# Patient Record
Sex: Female | Born: 1980 | Race: Black or African American | Hispanic: No | Marital: Single | Smoking: Never smoker
Health system: Southern US, Community
[De-identification: ages and names within clinical notes are randomized; demographics above are authoritative.]

## PROBLEM LIST (undated history)

## (undated) DIAGNOSIS — IMO0002 Reserved for concepts with insufficient information to code with codable children: Secondary | ICD-10-CM

## (undated) DIAGNOSIS — Z8659 Personal history of other mental and behavioral disorders: Secondary | ICD-10-CM

## (undated) DIAGNOSIS — K589 Irritable bowel syndrome without diarrhea: Secondary | ICD-10-CM

## (undated) DIAGNOSIS — O98819 Other maternal infectious and parasitic diseases complicating pregnancy, unspecified trimester: Secondary | ICD-10-CM

## (undated) DIAGNOSIS — B951 Streptococcus, group B, as the cause of diseases classified elsewhere: Secondary | ICD-10-CM

## (undated) DIAGNOSIS — Z87898 Personal history of other specified conditions: Secondary | ICD-10-CM

## (undated) DIAGNOSIS — Z6791 Unspecified blood type, Rh negative: Secondary | ICD-10-CM

## (undated) DIAGNOSIS — Z87440 Personal history of urinary (tract) infections: Secondary | ICD-10-CM

## (undated) DIAGNOSIS — D649 Anemia, unspecified: Secondary | ICD-10-CM

## (undated) DIAGNOSIS — Z5189 Encounter for other specified aftercare: Secondary | ICD-10-CM

## (undated) DIAGNOSIS — O00109 Unspecified tubal pregnancy without intrauterine pregnancy: Secondary | ICD-10-CM

## (undated) DIAGNOSIS — E611 Iron deficiency: Secondary | ICD-10-CM

## (undated) DIAGNOSIS — Z8632 Personal history of gestational diabetes: Secondary | ICD-10-CM

## (undated) DIAGNOSIS — R7303 Prediabetes: Secondary | ICD-10-CM

## (undated) DIAGNOSIS — Z8719 Personal history of other diseases of the digestive system: Secondary | ICD-10-CM

## (undated) DIAGNOSIS — Z8619 Personal history of other infectious and parasitic diseases: Secondary | ICD-10-CM

## (undated) HISTORY — DX: Personal history of gestational diabetes: Z86.32

## (undated) HISTORY — DX: Encounter for other specified aftercare: Z51.89

## (undated) HISTORY — DX: Personal history of other mental and behavioral disorders: Z86.59

## (undated) HISTORY — DX: Streptococcus, group b, as the cause of diseases classified elsewhere: B95.1

## (undated) HISTORY — DX: Unspecified blood type, rh negative: Z67.91

## (undated) HISTORY — DX: Anemia, unspecified: D64.9

## (undated) HISTORY — DX: Prediabetes: R73.03

## (undated) HISTORY — DX: Personal history of other specified conditions: Z87.898

## (undated) HISTORY — DX: Personal history of other diseases of the digestive system: Z87.19

## (undated) HISTORY — DX: Personal history of urinary (tract) infections: Z87.440

## (undated) HISTORY — DX: Reserved for concepts with insufficient information to code with codable children: IMO0002

## (undated) HISTORY — DX: Iron deficiency: E61.1

## (undated) HISTORY — DX: Irritable bowel syndrome, unspecified: K58.9

## (undated) HISTORY — DX: Personal history of other infectious and parasitic diseases: Z86.19

## (undated) HISTORY — DX: Other maternal infectious and parasitic diseases complicating pregnancy, unspecified trimester: O98.819

---
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
## 1999-03-13 ENCOUNTER — Other Ambulatory Visit: Admission: RE | Admit: 1999-03-13 | Discharge: 1999-03-13 | Payer: Self-pay | Admitting: Family Medicine

## 2000-03-13 ENCOUNTER — Other Ambulatory Visit: Admission: RE | Admit: 2000-03-13 | Discharge: 2000-03-13 | Payer: Self-pay | Admitting: Family Medicine

## 2001-04-17 ENCOUNTER — Other Ambulatory Visit: Admission: RE | Admit: 2001-04-17 | Discharge: 2001-04-17 | Payer: Self-pay | Admitting: Family Medicine

## 2002-04-19 ENCOUNTER — Other Ambulatory Visit: Admission: RE | Admit: 2002-04-19 | Discharge: 2002-04-19 | Payer: Self-pay | Admitting: Family Medicine

## 2002-08-11 ENCOUNTER — Emergency Department (HOSPITAL_COMMUNITY): Admission: EM | Admit: 2002-08-11 | Discharge: 2002-08-11 | Payer: Self-pay | Admitting: Emergency Medicine

## 2003-04-20 ENCOUNTER — Other Ambulatory Visit: Admission: RE | Admit: 2003-04-20 | Discharge: 2003-04-20 | Payer: Self-pay | Admitting: Family Medicine

## 2004-07-09 ENCOUNTER — Other Ambulatory Visit: Admission: RE | Admit: 2004-07-09 | Discharge: 2004-07-09 | Payer: Self-pay | Admitting: Family Medicine

## 2004-08-22 ENCOUNTER — Other Ambulatory Visit: Admission: RE | Admit: 2004-08-22 | Discharge: 2004-08-22 | Payer: Self-pay | Admitting: Obstetrics and Gynecology

## 2005-03-21 ENCOUNTER — Other Ambulatory Visit: Admission: RE | Admit: 2005-03-21 | Discharge: 2005-03-21 | Payer: Self-pay | Admitting: Obstetrics and Gynecology

## 2006-07-28 ENCOUNTER — Other Ambulatory Visit: Admission: RE | Admit: 2006-07-28 | Discharge: 2006-07-28 | Payer: Self-pay | Admitting: Family Medicine

## 2006-12-22 ENCOUNTER — Emergency Department (HOSPITAL_COMMUNITY): Admission: EM | Admit: 2006-12-22 | Discharge: 2006-12-22 | Payer: Self-pay | Admitting: Emergency Medicine

## 2006-12-25 ENCOUNTER — Emergency Department (HOSPITAL_COMMUNITY): Admission: EM | Admit: 2006-12-25 | Discharge: 2006-12-25 | Payer: Self-pay | Admitting: Emergency Medicine

## 2007-06-10 ENCOUNTER — Emergency Department (HOSPITAL_COMMUNITY): Admission: EM | Admit: 2007-06-10 | Discharge: 2007-06-10 | Payer: Self-pay | Admitting: Emergency Medicine

## 2008-09-02 ENCOUNTER — Other Ambulatory Visit: Admission: RE | Admit: 2008-09-02 | Discharge: 2008-09-02 | Payer: Self-pay | Admitting: Family Medicine

## 2009-06-06 ENCOUNTER — Emergency Department (HOSPITAL_COMMUNITY): Admission: EM | Admit: 2009-06-06 | Discharge: 2009-06-07 | Payer: Self-pay | Admitting: Emergency Medicine

## 2009-11-28 ENCOUNTER — Other Ambulatory Visit: Admission: RE | Admit: 2009-11-28 | Discharge: 2009-11-28 | Payer: Self-pay | Admitting: Family Medicine

## 2010-07-31 DIAGNOSIS — Z8719 Personal history of other diseases of the digestive system: Secondary | ICD-10-CM

## 2010-07-31 DIAGNOSIS — IMO0001 Reserved for inherently not codable concepts without codable children: Secondary | ICD-10-CM

## 2010-07-31 DIAGNOSIS — Z5189 Encounter for other specified aftercare: Secondary | ICD-10-CM

## 2010-07-31 HISTORY — PX: SALPINGECTOMY: SHX328

## 2010-07-31 HISTORY — DX: Encounter for other specified aftercare: Z51.89

## 2010-07-31 HISTORY — PX: ECTOPIC PREGNANCY SURGERY: SHX613

## 2010-07-31 HISTORY — DX: Personal history of other diseases of the digestive system: Z87.19

## 2010-07-31 HISTORY — DX: Reserved for inherently not codable concepts without codable children: IMO0001

## 2010-08-05 ENCOUNTER — Emergency Department (HOSPITAL_COMMUNITY)
Admission: EM | Admit: 2010-08-05 | Discharge: 2010-08-05 | Disposition: A | Discharge status: Other Acute Inpatient Hospital | Payer: BC Managed Care – PPO | Source: Home / Self Care | Attending: Emergency Medicine | Admitting: Emergency Medicine

## 2010-08-05 ENCOUNTER — Emergency Department (HOSPITAL_COMMUNITY): Payer: BC Managed Care – PPO

## 2010-08-05 ENCOUNTER — Ambulatory Visit (HOSPITAL_COMMUNITY)
Admission: AD | Admit: 2010-08-05 | Discharge: 2010-08-05 | Disposition: A | Discharge status: Home / Self Care | Payer: BC Managed Care – PPO | Source: Ambulatory Visit | Attending: Obstetrics and Gynecology | Admitting: Obstetrics and Gynecology

## 2010-08-05 ENCOUNTER — Other Ambulatory Visit: Payer: Self-pay | Admitting: Obstetrics and Gynecology

## 2010-08-05 DIAGNOSIS — O009 Unspecified ectopic pregnancy without intrauterine pregnancy: Secondary | ICD-10-CM | POA: Insufficient documentation

## 2010-08-05 DIAGNOSIS — O00109 Unspecified tubal pregnancy without intrauterine pregnancy: Secondary | ICD-10-CM | POA: Insufficient documentation

## 2010-08-05 DIAGNOSIS — R109 Unspecified abdominal pain: Secondary | ICD-10-CM | POA: Insufficient documentation

## 2010-08-05 LAB — WET PREP, GENITAL: Clue Cells Wet Prep HPF POC: NONE SEEN

## 2010-08-05 LAB — URINALYSIS, ROUTINE W REFLEX MICROSCOPIC
Bilirubin Urine: NEGATIVE
Glucose, UA: NEGATIVE mg/dL
Ketones, ur: NEGATIVE mg/dL
Nitrite: NEGATIVE
Protein, ur: NEGATIVE mg/dL
Specific Gravity, Urine: 1.035 — ABNORMAL HIGH (ref 1.005–1.030)
pH: 5.5 (ref 5.0–8.0)

## 2010-08-05 LAB — CBC
HCT: 36.5 % (ref 36.0–46.0)
Hemoglobin: 12 g/dL (ref 12.0–15.0)
MCH: 31.7 pg (ref 26.0–34.0)
MCHC: 32.9 g/dL (ref 30.0–36.0)
MCV: 94.9 fL (ref 78.0–100.0)
Platelets: 230 10*3/uL (ref 150–400)
RBC: 3.93 MIL/uL (ref 3.87–5.11)
RDW: 12.3 % (ref 11.5–15.5)
WBC: 8.4 10*3/uL (ref 4.0–10.5)

## 2010-08-05 LAB — COMPREHENSIVE METABOLIC PANEL
AST: 24 U/L (ref 0–37)
Albumin: 3.1 g/dL — ABNORMAL LOW (ref 3.5–5.2)
BUN: 10 mg/dL (ref 6–23)
Calcium: 8.8 mg/dL (ref 8.4–10.5)
GFR calc Af Amer: >60 mL/min (ref >60)
GFR calc non Af Amer: >60 mL/min (ref >60)
Glucose, Bld: 103 mg/dL — ABNORMAL HIGH (ref 70–99)
Potassium: 4.4 mEq/L (ref 3.5–5.1)
Sodium: 136 mEq/L (ref 135–145)
Total Bilirubin: 0.2 mg/dL — ABNORMAL LOW (ref 0.3–1.2)
Total Protein: 6.5 g/dL (ref 6.0–8.3)

## 2010-08-05 LAB — HCG, QUANTITATIVE, PREGNANCY: hCG, Beta Chain, Quant, S: 9829 m[IU]/mL — ABNORMAL HIGH (ref <5)

## 2010-08-05 LAB — DIFFERENTIAL
Basophils Relative: 0 % (ref 0–1)
Lymphocytes Relative: 17 % (ref 12–46)
Lymphs Abs: 1.4 10*3/uL (ref 0.7–4.0)
Monocytes Absolute: 0.7 10*3/uL (ref 0.1–1.0)
Neutro Abs: 6 10*3/uL (ref 1.7–7.7)
Neutrophils Relative %: 71 % (ref 43–77)

## 2010-08-05 LAB — URINE MICROSCOPIC-ADD ON

## 2010-08-05 LAB — LIPASE, BLOOD: Lipase: 21 U/L (ref 11–59)

## 2010-08-05 LAB — ABO/RH: ABO/RH(D): O NEG

## 2010-08-06 LAB — RHOGAM INJECTION

## 2010-08-06 LAB — URINE CULTURE: Colony Count: 15000

## 2010-08-07 LAB — GC/CHLAMYDIA PROBE AMP, GENITAL
Chlamydia, DNA Probe: NEGATIVE
GC Probe Amp, Genital: NEGATIVE

## 2010-08-09 NOTE — Op Note (Signed)
Susan Hale, Susan Hale                ACCOUNT NO.:  [id number]  MEDICAL RECORD NO.:  [id number]           PATIENT TYPE:  O  LOCATION:  [facility]                          [facility]  PHYSICIAN:  Osborn Coho, M.D.   DATE OF BIRTH:  [birth year]  DATE OF PROCEDURE:  08/05/2010 DATE OF DISCHARGE:                              OPERATIVE REPORT   PREOPERATIVE DIAGNOSES: 1. Right ectopic pregnancy. 2. Abdominal pain.  POSTOPERATIVE DIAGNOSES: 1. Ruptured right ectopic pregnancy. 2. Abdominal pain.  PROCEDURE:  Laparoscopic right salpingectomy.  ATTENDING DOCTOR:  Osborn Coho, MD  ANESTHESIA:  General.  SPECIMENS TO PATHOLOGY:  Right fallopian tube with ectopic pregnancy.  FINDINGS:  Ruptured right fallopian tube with ectopic pregnancy.  FLUIDS:  2100 mL.  URINE OUTPUT:  100 mL of clear urine.  ESTIMATED BLOOD LOSS:  150 mL hemoperitoneum and minimal EBL intraop.  COMPLICATIONS:  None.  PROCEDURE:  The patient was taken to the operating room after the risks, benefits, and alternatives were discussed with the patient.  The patient verbalized understanding and consent was signed and witnessed.  The patient was placed under general anesthesia and prepped and draped in the normal sterile fashion in the dorsal lithotomy position.  A bivalve speculum was placed in the patient's vagina and the Hulka tenaculum placed for intrauterine manipulation.  After gowning and re-gloving, attention was then turned to the abdomen where a 10-mm incision was made at the umbilicus after injecting 10 mL of 0.25% Marcaine.  The Veress needle was introduced into the intraabdominal cavity and pneumoperitoneum achieved.  The 10-mm trocar was advanced into the intraabdominal cavity.  Laparoscope was introduced and hemoperitoneum noted.  Attention was then turned to left lower quadrant where a 5-mm incision was made and a 5-mm trocar advanced under direct visualization. A 5-mm incision was made at the  suprapubic site and 5-mm trocar advanced under direct visualization.  The right fallopian tube was elevated after inspecting the abdomen and bilateral ovaries were noted to be within normal limits.  The left fallopian tube appeared to be within normal limits.  The right fallopian tube contained an ruptured ectopic pregnancy.  The right fallopian tube was grasped and elevated and the ectopic pregnancy along with the fallopian tube was cauterized and cut with the gyrus.  All specimens were placed in Endopouches, which were removed via a 10-mm suprapubic port.  The 5-mm trocar had been removed and the incision at the suprapubic port was increased to 10 mm and a 10- mm trocar introduced.  All specimens were sent to Pathology.  The intraabdominal cavity was copiously irrigated and the right pedicle was noted to be hemostatic.  The patient was taken out of Trendelenburg and pneumoperitoneum relieved, and the suprapubic trocar and the left lower quadrant trocar were removed under direct visualization.  The umbilical trocar was removed under direct visualization as well after pneumoperitoneum relieved completely.  The fascia was repaired at the umbilicus with 0 Vicryl via figure-of-eight stitch and the same was done at the suprapubic 10-mm incision.  The subcutaneous tissue was reapproximated with interrupted stitch at the umbilicus and the suprapubic incision.  All skin incisions were then repaired with Dermabond.  Sponge, lap, and needle count was correct.  The Hulka tenaculum was removed.  The patient tolerated the procedure well and was awaiting transfer to the recovery room in good condition.     Osborn Coho, M.D.     AR/MEDQ  D:  08/05/2010  T:  08/05/2010  Job:  [id number]  Electronically Signed by Osborn Coho M.D. on 08/09/2010 07:40:38 AM

## 2010-10-30 LAB — HIV ANTIBODY (ROUTINE TESTING W REFLEX): HIV: NONREACTIVE

## 2010-10-30 LAB — RPR: RPR: NONREACTIVE

## 2011-01-10 LAB — WOUND CULTURE: Gram Stain: NONE SEEN

## 2011-02-20 ENCOUNTER — Encounter: Payer: Self-pay | Admitting: Dietician

## 2011-02-20 ENCOUNTER — Encounter: Payer: BC Managed Care – PPO | Attending: Obstetrics and Gynecology | Admitting: Dietician

## 2011-02-20 DIAGNOSIS — Z713 Dietary counseling and surveillance: Secondary | ICD-10-CM | POA: Insufficient documentation

## 2011-02-20 DIAGNOSIS — E119 Type 2 diabetes mellitus without complications: Secondary | ICD-10-CM | POA: Insufficient documentation

## 2011-02-20 NOTE — Progress Notes (Signed)
  Patient was seen on 02/20/2011 for Gestational Diabetes self-management class at the [facility]. The following learning objectives were met by the patient during this course:   States the definition of Gestational Diabetes  States why dietary management is important in controlling blood glucose  Describes the effects each nutrient has on blood glucose levels  Demonstrates ability to create a balanced meal plan  Demonstrates carbohydrate counting   States when to check blood glucose levels  Demonstrates proper blood glucose monitoring techniques  States the effect of stress and exercise on blood glucose levels  States the importance of limiting caffeine and abstaining from alcohol and smoking  Blood glucose monitor given: One Touch Verio Meter (Lot #:[id number]  And expiration: 05/2012) and  2 Box Lancets (Lot: #[id number]  Expiration 05/2012)  Blood glucose reading: 88 mg post Breakfast. Note:  I did not have the preferred Medicaid Accu-Chek meter today.  I have contacted the rep and she is shipping [place] the Medicaid meters.  I have supplied the patient with a meter and strips to cover until the correct meter arrives on Monday (02/25/2011).  Patient instructed to monitor glucose levels: FBS: 60 - <90 1 hour: <140 2 hour: <120  Patient received handouts:  Nutrition Diabetes and Pregnancy  Carbohydrate Counting List  Patient will be seen for follow-up as needed.

## 2011-02-26 ENCOUNTER — Encounter: Payer: Self-pay | Admitting: *Deleted

## 2011-04-01 ENCOUNTER — Encounter (HOSPITAL_COMMUNITY): Payer: Self-pay

## 2011-04-01 ENCOUNTER — Inpatient Hospital Stay (HOSPITAL_COMMUNITY)
Admission: AD | Admit: 2011-04-01 | Discharge: 2011-04-01 | Disposition: A | Discharge status: Home / Self Care | Payer: BC Managed Care – PPO | Source: Ambulatory Visit | Attending: Obstetrics and Gynecology | Admitting: Obstetrics and Gynecology

## 2011-04-01 DIAGNOSIS — Z298 Encounter for other specified prophylactic measures: Secondary | ICD-10-CM | POA: Insufficient documentation

## 2011-04-01 DIAGNOSIS — Z3480 Encounter for supervision of other normal pregnancy, unspecified trimester: Secondary | ICD-10-CM | POA: Insufficient documentation

## 2011-04-01 DIAGNOSIS — Z2989 Encounter for other specified prophylactic measures: Secondary | ICD-10-CM | POA: Insufficient documentation

## 2011-04-01 HISTORY — DX: Unspecified tubal pregnancy without intrauterine pregnancy: O00.109

## 2011-04-01 MED ORDER — RHO D IMMUNE GLOBULIN 1500 UNIT/2ML IJ SOLN
300.0000 ug | Freq: Once | INTRAMUSCULAR | Status: AC
  Administered 2011-04-01: 300 ug via INTRAMUSCULAR

## 2011-04-01 NOTE — Progress Notes (Signed)
Pt states here for rhophylac injection only, denies pain or bleeding, +FM.

## 2011-04-02 LAB — RH IG WORKUP (INCLUDES ABO/RH)
Antibody Screen: NEGATIVE
Unit division: 0

## 2011-04-02 NOTE — L&D Delivery Note (Signed)
Delivery Note At 10:13 AM a viable female, "Susan Hale", was delivered at home attended by [employer] and EMS via Vaginal, Spontaneous Delivery (Presentation: ;  ).  Reported onset of contractions at 2am, but not very painful.  Unknown SROM time (EMS reports it occurred prior to their arrival).  Placenta undelivered upon arrival at [facility].    APGAR: 8, 9; weight 7 lb 13 oz (3544 g).   Placenta status: Intact, Spontaneous.  Cord: 3 vessels with the following complications: None.  Cord pH: NA. No lacerations noted.   Review of patient's prenatal record revealed + GBS status and elevated 1 hour, 3 hour, and Hgb A1C likely consistent with Type 2 diabetes--patient has not been on any medication and has not been monitoring blood sugars at home.  Anesthesia: None  Episiotomy: None Lacerations: None Suture Repair: NA Est. Blood Loss (mL): 150 cc  Mom to postpartum.  Baby to mom's arms.  Plans bottlefeeding. Initial BP elevated, then improved. Will check PIH labs, FBS tomorrow, and 2 hour pcs starting today.  Nigel Bridgeman 06/05/2011, 11:43 AM

## 2011-05-20 LAB — GC/CHLAMYDIA PROBE AMP, GENITAL
Chlamydia: NEGATIVE
Gonorrhea: NEGATIVE

## 2011-05-30 ENCOUNTER — Encounter (INDEPENDENT_AMBULATORY_CARE_PROVIDER_SITE_OTHER): Payer: BC Managed Care – PPO | Admitting: Registered Nurse

## 2011-05-30 DIAGNOSIS — Z331 Pregnant state, incidental: Secondary | ICD-10-CM

## 2011-06-03 ENCOUNTER — Encounter (INDEPENDENT_AMBULATORY_CARE_PROVIDER_SITE_OTHER): Payer: BC Managed Care – PPO | Admitting: Obstetrics and Gynecology

## 2011-06-03 DIAGNOSIS — Z331 Pregnant state, incidental: Secondary | ICD-10-CM

## 2011-06-05 ENCOUNTER — Inpatient Hospital Stay (HOSPITAL_COMMUNITY)
Admission: AD | Admit: 2011-06-05 | Discharge: 2011-06-07 | DRG: 376 | Disposition: A | Discharge status: Home Health | Payer: BC Managed Care – PPO | Attending: Obstetrics and Gynecology | Admitting: Obstetrics and Gynecology

## 2011-06-05 ENCOUNTER — Encounter (HOSPITAL_COMMUNITY): Payer: Self-pay | Admitting: *Deleted

## 2011-06-05 DIAGNOSIS — Z331 Pregnant state, incidental: Secondary | ICD-10-CM

## 2011-06-05 DIAGNOSIS — E119 Type 2 diabetes mellitus without complications: Secondary | ICD-10-CM | POA: Diagnosis present

## 2011-06-05 DIAGNOSIS — O2493 Unspecified diabetes mellitus in the puerperium: Principal | ICD-10-CM | POA: Diagnosis present

## 2011-06-05 DIAGNOSIS — O99893 Other specified diseases and conditions complicating puerperium: Secondary | ICD-10-CM | POA: Diagnosis present

## 2011-06-05 DIAGNOSIS — Z22330 Carrier of Group B streptococcus: Secondary | ICD-10-CM

## 2011-06-05 LAB — CBC
Hemoglobin: 14.6 g/dL (ref 12.0–15.0)
MCH: 32.2 pg (ref 26.0–34.0)
MCHC: 33.5 g/dL (ref 30.0–36.0)
Platelets: 180 10*3/uL (ref 150–400)
RBC: 4.54 MIL/uL (ref 3.87–5.11)

## 2011-06-05 LAB — COMPREHENSIVE METABOLIC PANEL
ALT: 27 U/L (ref 0–35)
AST: 33 U/L (ref 0–37)
Alkaline Phosphatase: 194 U/L — ABNORMAL HIGH (ref 39–117)
CO2: 23 mEq/L (ref 19–32)
Calcium: 9.4 mg/dL (ref 8.4–10.5)
GFR calc Af Amer: 85 mL/min — ABNORMAL LOW (ref >90)
Glucose, Bld: 86 mg/dL (ref 70–99)
Potassium: 4.2 mEq/L (ref 3.5–5.1)
Sodium: 136 mEq/L (ref 135–145)
Total Protein: 5.8 g/dL — ABNORMAL LOW (ref 6.0–8.3)

## 2011-06-05 LAB — GLUCOSE, CAPILLARY
Glucose-Capillary: 164 mg/dL — ABNORMAL HIGH (ref 70–99)
Glucose-Capillary: 105 mg/dL — ABNORMAL HIGH (ref 70–99)

## 2011-06-05 MED ORDER — IBUPROFEN 600 MG PO TABS
600.0000 mg | ORAL_TABLET | Freq: Four times a day (QID) | ORAL | Status: DC
  Administered 2011-06-05 – 2011-06-07 (×8): 600 mg via ORAL
  Filled 2011-06-05 (×8): qty 1

## 2011-06-05 MED ORDER — ZOLPIDEM TARTRATE 5 MG PO TABS: 5.0000 mg | ORAL_TABLET | Freq: Every evening | ORAL | Status: DC | PRN

## 2011-06-05 MED ORDER — ONDANSETRON HCL 4 MG PO TABS: 4.0000 mg | ORAL_TABLET | ORAL | Status: DC | PRN

## 2011-06-05 MED ORDER — OXYTOCIN 20 UNITS IN LACTATED RINGERS INFUSION - SIMPLE
INTRAVENOUS | Status: AC
  Filled 2011-06-05: qty 1000

## 2011-06-05 MED ORDER — TETANUS-DIPHTH-ACELL PERTUSSIS 5-2.5-18.5 LF-MCG/0.5 IM SUSP
0.5000 mL | Freq: Once | INTRAMUSCULAR | Status: AC
  Administered 2011-06-06: 0.5 mL via INTRAMUSCULAR
  Filled 2011-06-05: qty 0.5

## 2011-06-05 MED ORDER — SIMETHICONE 80 MG PO CHEW: 80.0000 mg | CHEWABLE_TABLET | ORAL | Status: DC | PRN

## 2011-06-05 MED ORDER — DIBUCAINE 1 % RE OINT
1.0000 "application " | TOPICAL_OINTMENT | RECTAL | Status: DC | PRN
  Filled 2011-06-05: qty 28

## 2011-06-05 MED ORDER — OXYCODONE-ACETAMINOPHEN 5-325 MG PO TABS
1.0000 | ORAL_TABLET | ORAL | Status: DC | PRN
  Administered 2011-06-05 – 2011-06-06 (×2): 1 via ORAL
  Filled 2011-06-05 (×2): qty 1

## 2011-06-05 MED ORDER — LANOLIN HYDROUS EX OINT: TOPICAL_OINTMENT | CUTANEOUS | Status: DC | PRN

## 2011-06-05 MED ORDER — OXYTOCIN 20 UNITS IN LACTATED RINGERS INFUSION - SIMPLE
125.0000 mL/h | INTRAVENOUS | Status: DC | PRN
  Administered 2011-06-05: 999 mL/h via INTRAVENOUS

## 2011-06-05 MED ORDER — DIPHENHYDRAMINE HCL 25 MG PO CAPS: 25.0000 mg | ORAL_CAPSULE | Freq: Four times a day (QID) | ORAL | Status: DC | PRN

## 2011-06-05 MED ORDER — WITCH HAZEL-GLYCERIN EX PADS: 1.0000 "application " | MEDICATED_PAD | CUTANEOUS | Status: DC | PRN

## 2011-06-05 MED ORDER — PRENATAL MULTIVITAMIN CH
1.0000 | ORAL_TABLET | Freq: Every day | ORAL | Status: DC
  Administered 2011-06-05 – 2011-06-07 (×3): 1 via ORAL
  Filled 2011-06-05 (×3): qty 1

## 2011-06-05 MED ORDER — ONDANSETRON HCL 4 MG/2ML IJ SOLN: 4.0000 mg | INTRAMUSCULAR | Status: DC | PRN

## 2011-06-05 MED ORDER — BENZOCAINE-MENTHOL 20-0.5 % EX AERO
INHALATION_SPRAY | CUTANEOUS | Status: AC
  Administered 2011-06-05: 1 via TOPICAL
  Filled 2011-06-05: qty 56

## 2011-06-05 MED ORDER — BENZOCAINE-MENTHOL 20-0.5 % EX AERO
1.0000 "application " | INHALATION_SPRAY | CUTANEOUS | Status: DC | PRN
  Administered 2011-06-05: 1 via TOPICAL

## 2011-06-05 MED ORDER — SENNOSIDES-DOCUSATE SODIUM 8.6-50 MG PO TABS
2.0000 | ORAL_TABLET | Freq: Every day | ORAL | Status: DC
  Administered 2011-06-05 – 2011-06-06 (×2): 2 via ORAL

## 2011-06-05 NOTE — H&P (Signed)
Susan Hale is a 31 y.o. female, [id number] at 66 3/7 weeks presenting via EMS after delivering at home.  Had onset of contractions at 2am, but didn't think it was very strong.  Had urge to have BM, and family called EMS when patient felt baby coming.  Fire Department arrived and delivered the baby, then the patient was transported to [facility] by EMS with placenta still in place.    Pregnancy remarkable for: Probable Type 2 diabetes, with abnormal 1 hour and 3 hour GTT, elevated Hgb A1C, but not monitoring CBGs, no meds Rh negative Hx ectopic, with salpingectomy Positive GBS Rubella non-immune  . History of present pregnancy: Patient entered care at 7 weeks.  EDC of 06/16/11 was established by LMP and in agreement with 7 week [place].  Anatomy scan was done at 20 weeks, with normal findings and an posterior placenta.  Further ultrasounds were done at 30 and 36 weeks, with normal growth and fluid. She had an elevated 1 hour glucola at 20 weeks of 211, was referred to [facility], but patient reported all CBG values were WNL.  3 hour GTT was done with abnormal findings and Hgb A1C of 6.5 c/w Type 2 diabetes.  She reported CBG values as normal through the rest of pregnancy. At her last evalution, she was 2 cm.  OB History    Grav Para Term Preterm Abortions TAB SAB Ect Mult Living   3 1 1  1   1  1     #1--1997 SVB female, 1 hour labor, 5+12, no medication #2--2012 1st trimester ectopic,  Right salpingectomy #3--Current  Past Medical History  Diagnosis Date  . Diabetes mellitus   . Ectopic pregnancy, tubal   Blood transfusion s/p ectopic 2012  Past Surgical History  Procedure Date  . Salpingectomy   . Wisdom tooth extraction   Anal fissure 2012.  Family History: family history includes Diabetes in her mother.  There is no history of Anesthesia problems, and Hypotension, and Malignant hyperthermia, and Pseudochol deficiency, .  Social History:  reports that she has never smoked. She has never used  smokeless tobacco. She reports that she does not drink alcohol or use illicit drugs. FOB, Chauncey Reading, involved and supportive, but not present with her today on arrival.  Her mother came to hospital after patient arrival.  Patient is bus driver, college educated.  ROS:  Minimal vaginal bleeding, mild cramping.      Blood pressure 139/85, pulse 81, temperature 97.2 F (36.2 C), temperature source Oral, resp. rate 20, height 5\' 5"  (1.651 m), weight 97.07 kg (214 lb).  Chest clear Heart RRR without murmur Abd fundus firm, just above umbilicus Pelvic--umbilical cord extruding from vagina, no active bleeding No lacerations noted on initial exam. Ext WNL  Prenatal labs: ABO, Rh: --/--/O NEG (12/31 1516) Antibody: NEG (12/31 1516) Rubella:  Equivocal rubella RPR:   NR HBsAg:   Neg HIV:   NR GBS:   Positive Elevated 1 hour and 3 hour GTT, Hgb A1C 6.5 Cultures negative at 36 weeks  Assessment/Plan: IUP at 38 3/7 weeks Precipitous delivery at home GBS positive, no prophylaxis Probable Type 2 diabetes, minimal compliance   Plan: Admit to [facility], evaluated in [place] [facility] for transfer to [facility]. FBS tomorrow, 2 hour pcs starting today.  Nigel Bridgeman 06/05/2011, 11:51 AM

## 2011-06-06 LAB — CBC
Hemoglobin: 13.1 g/dL (ref 12.0–15.0)
Platelets: 180 10*3/uL (ref 150–400)
RBC: 4.14 MIL/uL (ref 3.87–5.11)
WBC: 11.4 10*3/uL — ABNORMAL HIGH (ref 4.0–10.5)

## 2011-06-06 LAB — GLUCOSE, CAPILLARY

## 2011-06-06 LAB — GLUCOSE, RANDOM: Glucose, Bld: 69 mg/dL — ABNORMAL LOW (ref 70–99)

## 2011-06-06 MED ORDER — RHO D IMMUNE GLOBULIN 1500 UNIT/2ML IJ SOLN
300.0000 ug | Freq: Once | INTRAMUSCULAR | Status: AC
  Administered 2011-06-06: 300 ug via INTRAMUSCULAR
  Filled 2011-06-06: qty 2

## 2011-06-06 NOTE — Progress Notes (Signed)
Post Partum Day 1 Subjective: no complaints, up ad lib, voiding and tolerating PO  Objective: Blood pressure 95/66, pulse 89, temperature 98.4 F (36.9 C), temperature source Oral, resp. rate 18, height 5\' 5"  (1.651 m), weight 97.07 kg (214 lb), SpO2 97.00%, unknown if currently breastfeeding.  Physical Exam:  General: alert and cooperative Lochia: appropriate Uterine Fundus: firm Incision: NA DVT Evaluation: No evidence of DVT seen on physical exam.   Basename 06/06/11 0530 06/05/11 1350  HGB 13.1 14.6  HCT 40.0 43.6    Assessment/Plan: Contraception DepoProvera prior to discharge. May want to go home this afternoon. Bottle feeding.   LOS: 1 day   Susan Hale 06/06/2011, 9:13 AM

## 2011-06-07 LAB — RH IG WORKUP (INCLUDES ABO/RH)
ABO/RH(D): O NEG
Fetal Screen: NEGATIVE
Gestational Age(Wks): 38.3

## 2011-06-07 LAB — GLUCOSE, CAPILLARY: Glucose-Capillary: 69 mg/dL — ABNORMAL LOW (ref 70–99)

## 2011-06-07 MED ORDER — MEASLES, MUMPS & RUBELLA VAC ~~LOC~~ INJ
0.5000 mL | INJECTION | Freq: Once | SUBCUTANEOUS | Status: AC
  Administered 2011-06-07: 0.5 mL via SUBCUTANEOUS
  Filled 2011-06-07 (×2): qty 0.5

## 2011-06-07 MED ORDER — IBUPROFEN 600 MG PO TABS: 600.0000 mg | ORAL_TABLET | Freq: Four times a day (QID) | ORAL | Status: AC | PRN

## 2011-06-07 MED ORDER — MEDROXYPROGESTERONE ACETATE 150 MG/ML IM SUSP
150.0000 mg | Freq: Once | INTRAMUSCULAR | Status: AC
  Administered 2011-06-07: 150 mg via INTRAMUSCULAR
  Filled 2011-06-07: qty 1

## 2011-06-07 MED ORDER — MEDROXYPROGESTERONE ACETATE 150 MG/ML IM SUSP: INTRAMUSCULAR | Status: DC

## 2011-06-07 NOTE — Plan of Care (Signed)
Problem: Discharge Progression Outcomes Goal: MMR given as ordered Outcome: Not Met (add Reason) Needs prior to  d/c

## 2011-06-07 NOTE — Discharge Instructions (Signed)
Vaginal Delivery Care After  Change your pad on each trip to the bathroom.   Wipe gently with toilet paper during your hospital stay. Always wipe from front to back. A spray bottle with warm tap water could also be used or a towelette if available.   Place your soiled pad and toilet paper in a bathroom wastebasket with a plastic bag liner.   During your hospital stay, save any clots. If you pass a clot while on the toilet, do not flush it. Also, if your vaginal flow seems excessive to you, notify nursing personnel.   The first time you get out of bed after delivery, wait for assistance from a nurse. Do not get up alone at any time if you feel weak or dizzy.   Bend and extend your ankles forcefully so that you feel the calves of your legs get hard. Do this 6 times every hour when you are in bed and awake.   Do not sit with one foot under you, dangle your legs over the edge of the bed, or maintain a position that hinders the circulation in your legs.   Many women experience after pains for 2 to 3 days after delivery. These after pains are mild uterine contractions. Ask the nurse for a pain medication if you need something for this. Sometimes breastfeeding stimulates after pains; if you find this to be true, ask for the medication  -  hour before the next feeding.   For you and your infant's protection, do not go beyond the door(s) of the obstetric unit. Do not carry your baby in your arms in the hallway. When taking your baby to and from your room, put your baby in the bassinet and push the bassinet.   Mothers may have their babies in their room as much as they desire.   Infant Formula Feeding Breastfeeding is always recommended as the first choice for feeding a baby. This is sometimes called "exclusive breastfeeding." That is the goal. But sometimes it is not possible. For instance:  The baby's mother might not be physically able to breastfeed.   The mother might not be present.   The  mother might have a health problem. She could have an infection. Or she could be dehydrated (not have enough fluids).   Some mothers are taking medicines for cancer or another health problem. These medicines can get into breast milk. Some of the medicines could harm a baby.   Some babies need extra calories. They may have been tiny at birth. Or they might be having trouble gaining weight.  Giving a baby formula in these situations is not a bad thing. Other caregivers can feed the baby. This can give the mother a break for sleep or work. It also gives the baby a chance to bond with other people. PRECAUTIONS  Make sure you know just how much formula the baby should get at each feeding. For example, newborns need 2 to 3 ounces every 2 to 3 hours. Markings on the bottle can help you keep track. It may be helpful to keep a log of how much the baby eats at each feeding.   Do not give the infant anything other than breast milk or formula. A baby must not drink cow's milk, juice, soda, or other sweet drinks.   Do not add cereal to the milk or formula, unless the baby's healthcare provider has said to do so.   Always hold the bottle during feedings. Never prop up a  bottle to feed a baby.   Never let the baby fall asleep with a bottle in the crib.   Never feed the baby a bottle that has been at room temperature for over two hours or from a bottle used for a previous feeding. After the baby finishes a feeding, throw away any formula left in the bottle.  BEFORE FEEDING  Prepare a bottle of formula. If you are using formula that was stored in the refrigerator, warm it up. To do this, hold it under warm, running water or in a pan of hot water for a few minutes. Never use a microwave to warm up a bottle of formula.   Test the temperature of the formula. Place a few drops on the inside of your wrist. It should be warm, but not hot.   Find a location that is comfortable for you and the baby. A large chair  with arms to support your arms is often a good choice. You may want to put pillows under your arms and under the baby for support.   Make sure the room temperature is OK. It should not be too hot or too cold for you and for the baby.   Have some burp cloths nearby. You will need them to clean up spills or spit-ups.  TO FEED THE BABY  Hold the baby close to your body. Make eye contact. This helps bonding.   Support the baby's head in the crook of your arm. Cradle him or her at a slight angle. The baby's head should be higher than the stomach. A baby should not be fed while lying flat.   Hold the bottle of formula at an angle. The formula should completely fill the neck of the bottle. It should cover the nipple. This will keep the baby from sucking in air. Swallowing air is uncomfortable.   Stroke the baby's cheek or lower lip lightly with the nipple. This can get the baby to open his or her mouth. Then, slip the nipple into the baby's mouth. Sucking and swallowing should start. You might need to try different types of nipples to find the one your baby likes best.   Let the baby tell you when he or she is done. The baby's head might turn away. Or, the baby's lips might push away the nipple. It is OK if the baby does not finish the bottle.   You might need to burp the baby halfway through a feeding. Then, just start feeding again.   Burp the baby again when the feeding is done.  Document Released: 04/09/2009 Document Revised: 03/07/2011 Document Reviewed: 04/09/2009 [facility] Patient Information 2012 [place], [place].

## 2011-06-07 NOTE — Discharge Summary (Signed)
  Obstetric Discharge Summary Reason for Admission: home delivery Prenatal Procedures: ultrasound Intrapartum Procedures: spontaneous vaginal delivery Postpartum Procedures: Rho(D) Ig Complications-Operative and Postpartum: none  Temp:  [98.1 F (36.7 C)-98.3 F (36.8 C)] 98.1 F (36.7 C) (03/08 0550) Pulse Rate:  [76-82] 82  (03/08 0550) Resp:  [18] 18  (03/08 0550) BP: (104-115)/(64-75) 105/69 mmHg (03/08 0550) Hemoglobin  Date Value Range Status  06/06/2011 13.1  12.0-15.0 (g/dL) Final     HCT  Date Value Range Status  06/06/2011 40.0  36.0-46.0 (%) Final    S: Denies complaints Doing well.  O: VSS  Assessment/Plan General: alert oriented Chest: clear Heart: RRR Scant lochia fundus firm 2-3 below umbilicus Negative Homan's; No signs of DVT Depo provera 150mg  IM today prior to discharge Reviewed signs and symptoms to report RTO x 5-6 weeks for PP exam/long-term contraception Info given re: long term methods and discussed options with patient  Hospital Course:  Hospital Course: Admitted following delivery. Patient delivered at home, delivery of placenta following admission. Positive GBS. A Periurethral tear no repair needed noted by V. Emilee Hero, CNM. Infant to FTN. Mother and infant had an uncomplicated postpartum course, with bottlefeeding going well. Mom's physical exam was WNL, and she was discharged home in stable condition. Contraception plan was depo provera.  She received adequate benefit from po pain medications.  Discharge Diagnoses: Term Pregnancy-delivered  Discharge Information: Date: 06/07/2011 Activity: nothing in vagina x 6 weeks Diet: routine Medications:  Medication List  As of 06/07/2011  8:40 AM   START taking these medications         ibuprofen 600 MG tablet   Commonly known as: ADVIL,MOTRIN   Take 1 tablet (600 mg total) by mouth every 6 (six) hours as needed for pain.      medroxyPROGESTERone 150 MG/ML injection   Commonly known as: DEPO-PROVERA   Please administer prior to discharge         STOP taking these medications         prenatal multivitamin Tabs          Where to get your medications    These are the prescriptions that you need to pick up.   You may get these medications from any pharmacy.         ibuprofen 600 MG tablet   medroxyPROGESTERone 150 MG/ML injection           Condition: stable Instructions: refer to practice specific booklet Discharge to: home Follow-up Information    Follow up with [facility]. Schedule an appointment as soon as possible for a visit in 6 weeks.         Newborn Data: Live born  Information for the patient's newborn:  Floretta, Petro [[id number]]  female ; APGAR 8,9 ; weight 7lb. 13oz.  Kizzie Fantasia CORI 06/07/2011, 8:40 AM

## 2011-06-12 ENCOUNTER — Encounter: Payer: BC Managed Care – PPO | Admitting: Obstetrics and Gynecology

## 2011-07-16 DIAGNOSIS — B069 Rubella without complication: Secondary | ICD-10-CM | POA: Insufficient documentation

## 2011-07-16 DIAGNOSIS — Z6791 Unspecified blood type, Rh negative: Secondary | ICD-10-CM

## 2011-07-17 ENCOUNTER — Encounter: Payer: Self-pay | Admitting: Obstetrics and Gynecology

## 2011-07-17 ENCOUNTER — Ambulatory Visit (INDEPENDENT_AMBULATORY_CARE_PROVIDER_SITE_OTHER): Payer: BC Managed Care – PPO | Admitting: Obstetrics and Gynecology

## 2011-07-17 DIAGNOSIS — I10 Essential (primary) hypertension: Secondary | ICD-10-CM

## 2011-07-17 DIAGNOSIS — IMO0002 Reserved for concepts with insufficient information to code with codable children: Secondary | ICD-10-CM

## 2011-07-17 DIAGNOSIS — F53 Postpartum depression: Secondary | ICD-10-CM | POA: Insufficient documentation

## 2011-07-17 DIAGNOSIS — O99345 Other mental disorders complicating the puerperium: Secondary | ICD-10-CM | POA: Insufficient documentation

## 2011-07-17 MED ORDER — MEDROXYPROGESTERONE ACETATE 150 MG/ML IM SUSP: 150.0000 mg | Freq: Once | INTRAMUSCULAR | Status: DC

## 2011-07-17 MED ORDER — SERTRALINE HCL 25 MG PO TABS: 25.0000 mg | ORAL_TABLET | Freq: Every day | ORAL | Status: DC

## 2011-07-17 NOTE — Progress Notes (Signed)
Addended by: Osborn Coho on: 07/17/2011 04:58 PM   Modules accepted: Orders

## 2011-07-17 NOTE — Progress Notes (Signed)
Date of delivery:06/05/2011 @ home! 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   Will continue Depo next dose due approx 08/30/11 AEX 12/2011

## 2011-07-25 ENCOUNTER — Ambulatory Visit (INDEPENDENT_AMBULATORY_CARE_PROVIDER_SITE_OTHER): Payer: BC Managed Care – PPO | Admitting: Obstetrics and Gynecology

## 2011-07-25 VITALS — BP 100/62 | Temp 97.9°F | Wt 186.0 lb

## 2011-07-25 DIAGNOSIS — F53 Postpartum depression: Secondary | ICD-10-CM

## 2011-07-25 DIAGNOSIS — R5381 Other malaise: Secondary | ICD-10-CM

## 2011-07-25 DIAGNOSIS — R5383 Other fatigue: Secondary | ICD-10-CM

## 2011-07-25 DIAGNOSIS — IMO0002 Reserved for concepts with insufficient information to code with codable children: Secondary | ICD-10-CM

## 2011-07-25 LAB — CBC
HCT: 44.6 % (ref 36.0–46.0)
Hemoglobin: 14.7 g/dL (ref 12.0–15.0)
MCV: 95.7 fL (ref 78.0–100.0)
RDW: 12.2 % (ref 11.5–15.5)
WBC: 5.5 10*3/uL (ref 4.0–10.5)

## 2011-07-25 MED ORDER — ZOLPIDEM TARTRATE 5 MG PO TABS: 5.0000 mg | ORAL_TABLET | Freq: Every evening | ORAL | Status: DC | PRN

## 2011-07-25 NOTE — Progress Notes (Signed)
Followup for pp depression.  Pt taking Zoloft for 1 wk.  No side effects.  Is still not sleeping well even though her daughter sleeps through the night.  C/o fatigue. Denies HI/SI.  No crying spells now. Rec:    TSH  CBC  Ambien 5 mg.  F/U 2 weeks to assess for need to increase Zoloft if all labs nl and fatigue and insomnia persist.   Will need 6 wk pp visit and 08/29/11 visit for Depo Provera

## 2011-07-25 NOTE — Patient Instructions (Signed)

## 2011-08-29 ENCOUNTER — Other Ambulatory Visit (INDEPENDENT_AMBULATORY_CARE_PROVIDER_SITE_OTHER): Payer: Medicaid Other

## 2011-08-29 DIAGNOSIS — Z3049 Encounter for surveillance of other contraceptives: Secondary | ICD-10-CM

## 2011-08-29 MED ORDER — MEDROXYPROGESTERONE ACETATE 150 MG/ML IM SUSP
150.0000 mg | Freq: Once | INTRAMUSCULAR | Status: AC
  Administered 2011-08-29: 150 mg via INTRAMUSCULAR

## 2011-11-18 ENCOUNTER — Telehealth: Payer: Self-pay | Admitting: Obstetrics and Gynecology

## 2011-11-19 ENCOUNTER — Other Ambulatory Visit (INDEPENDENT_AMBULATORY_CARE_PROVIDER_SITE_OTHER): Payer: BC Managed Care – PPO

## 2011-11-19 DIAGNOSIS — Z3009 Encounter for other general counseling and advice on contraception: Secondary | ICD-10-CM

## 2011-11-19 MED ORDER — MEDROXYPROGESTERONE ACETATE 150 MG/ML IM SUSP
150.0000 mg | Freq: Once | INTRAMUSCULAR | Status: AC
  Administered 2011-11-19: 150 mg via INTRAMUSCULAR

## 2011-11-19 NOTE — Progress Notes (Unsigned)
NEXT DEPO DUE 02-10-12.

## 2012-02-10 ENCOUNTER — Other Ambulatory Visit: Payer: BC Managed Care – PPO

## 2012-02-10 DIAGNOSIS — Z3042 Encounter for surveillance of injectable contraceptive: Secondary | ICD-10-CM

## 2012-02-10 MED ORDER — MEDROXYPROGESTERONE ACETATE 150 MG/ML IM SUSP
150.0000 mg | Freq: Once | INTRAMUSCULAR | Status: AC
  Administered 2012-02-10: 150 mg via INTRAMUSCULAR

## 2012-05-04 ENCOUNTER — Other Ambulatory Visit: Payer: BC Managed Care – PPO

## 2012-05-04 DIAGNOSIS — Z3009 Encounter for other general counseling and advice on contraception: Secondary | ICD-10-CM

## 2012-05-04 MED ORDER — MEDROXYPROGESTERONE ACETATE 150 MG/ML IM SUSP
150.0000 mg | Freq: Once | INTRAMUSCULAR | Status: AC
  Administered 2012-05-04: 150 mg via INTRAMUSCULAR

## 2012-05-04 NOTE — Progress Notes (Unsigned)
Next Depo due 07-26-2012 

## 2012-12-03 ENCOUNTER — Other Ambulatory Visit: Payer: Self-pay | Admitting: Obstetrics and Gynecology

## 2012-12-03 DIAGNOSIS — E049 Nontoxic goiter, unspecified: Secondary | ICD-10-CM

## 2012-12-07 ENCOUNTER — Other Ambulatory Visit: Payer: Self-pay

## 2012-12-10 ENCOUNTER — Other Ambulatory Visit: Payer: Self-pay

## 2013-07-06 ENCOUNTER — Encounter (HOSPITAL_COMMUNITY): Payer: Self-pay | Admitting: Emergency Medicine

## 2013-07-06 ENCOUNTER — Encounter (HOSPITAL_COMMUNITY): Payer: Self-pay | Admitting: *Deleted

## 2013-07-06 ENCOUNTER — Emergency Department (HOSPITAL_COMMUNITY)
Admission: EM | Admit: 2013-07-06 | Discharge: 2013-07-06 | Disposition: A | Discharge status: Home / Self Care | Payer: BC Managed Care – PPO | Attending: Emergency Medicine | Admitting: Emergency Medicine

## 2013-07-06 ENCOUNTER — Inpatient Hospital Stay (HOSPITAL_COMMUNITY)
Admission: AD | Admit: 2013-07-06 | Discharge: 2013-07-06 | Disposition: A | Discharge status: Home / Self Care | Payer: BC Managed Care – PPO | Source: Ambulatory Visit | Attending: Obstetrics and Gynecology | Admitting: Obstetrics and Gynecology

## 2013-07-06 DIAGNOSIS — R1032 Left lower quadrant pain: Secondary | ICD-10-CM | POA: Insufficient documentation

## 2013-07-06 DIAGNOSIS — Z862 Personal history of diseases of the blood and blood-forming organs and certain disorders involving the immune mechanism: Secondary | ICD-10-CM | POA: Insufficient documentation

## 2013-07-06 DIAGNOSIS — R142 Eructation: Secondary | ICD-10-CM | POA: Insufficient documentation

## 2013-07-06 DIAGNOSIS — R21 Rash and other nonspecific skin eruption: Secondary | ICD-10-CM | POA: Insufficient documentation

## 2013-07-06 DIAGNOSIS — Z3202 Encounter for pregnancy test, result negative: Secondary | ICD-10-CM | POA: Insufficient documentation

## 2013-07-06 DIAGNOSIS — R143 Flatulence: Secondary | ICD-10-CM

## 2013-07-06 DIAGNOSIS — R141 Gas pain: Secondary | ICD-10-CM | POA: Insufficient documentation

## 2013-07-06 DIAGNOSIS — B029 Zoster without complications: Secondary | ICD-10-CM

## 2013-07-06 DIAGNOSIS — IMO0002 Reserved for concepts with insufficient information to code with codable children: Secondary | ICD-10-CM | POA: Insufficient documentation

## 2013-07-06 DIAGNOSIS — Z8719 Personal history of other diseases of the digestive system: Secondary | ICD-10-CM | POA: Insufficient documentation

## 2013-07-06 DIAGNOSIS — Z8632 Personal history of gestational diabetes: Secondary | ICD-10-CM | POA: Insufficient documentation

## 2013-07-06 DIAGNOSIS — Z8742 Personal history of other diseases of the female genital tract: Secondary | ICD-10-CM | POA: Insufficient documentation

## 2013-07-06 DIAGNOSIS — R109 Unspecified abdominal pain: Secondary | ICD-10-CM | POA: Insufficient documentation

## 2013-07-06 DIAGNOSIS — Z8659 Personal history of other mental and behavioral disorders: Secondary | ICD-10-CM | POA: Insufficient documentation

## 2013-07-06 DIAGNOSIS — R350 Frequency of micturition: Secondary | ICD-10-CM | POA: Insufficient documentation

## 2013-07-06 DIAGNOSIS — Z79899 Other long term (current) drug therapy: Secondary | ICD-10-CM | POA: Insufficient documentation

## 2013-07-06 LAB — CBC WITH DIFFERENTIAL/PLATELET
Basophils Absolute: 0 10*3/uL (ref 0.0–0.1)
Basophils Relative: 1 % (ref 0–1)
Eosinophils Absolute: 0.1 10*3/uL (ref 0.0–0.7)
Eosinophils Relative: 3 % (ref 0–5)
HCT: 43.2 % (ref 36.0–46.0)
Hemoglobin: 15 g/dL (ref 12.0–15.0)
LYMPHS ABS: 1.7 10*3/uL (ref 0.7–4.0)
LYMPHS PCT: 34 % (ref 12–46)
MCH: 31.6 pg (ref 26.0–34.0)
MCHC: 34.7 g/dL (ref 30.0–36.0)
MCV: 90.9 fL (ref 78.0–100.0)
Monocytes Absolute: 0.5 10*3/uL (ref 0.1–1.0)
Monocytes Relative: 10 % (ref 3–12)
NEUTROS ABS: 2.7 10*3/uL (ref 1.7–7.7)
NEUTROS PCT: 53 % (ref 43–77)
Platelets: 206 10*3/uL (ref 150–400)
RBC: 4.75 MIL/uL (ref 3.87–5.11)
RDW: 11.7 % (ref 11.5–15.5)
WBC: 5 10*3/uL (ref 4.0–10.5)

## 2013-07-06 LAB — URINALYSIS, ROUTINE W REFLEX MICROSCOPIC
BILIRUBIN URINE: NEGATIVE
BILIRUBIN URINE: NEGATIVE
Glucose, UA: >1000 mg/dL — AB
Glucose, UA: >1000 mg/dL — AB
HGB URINE DIPSTICK: NEGATIVE
KETONES UR: 15 mg/dL — AB
Ketones, ur: 15 mg/dL — AB
Leukocytes, UA: NEGATIVE
Leukocytes, UA: NEGATIVE
Nitrite: NEGATIVE
Nitrite: NEGATIVE
Protein, ur: NEGATIVE mg/dL
Protein, ur: NEGATIVE mg/dL
SPECIFIC GRAVITY, URINE: 1.01 (ref 1.005–1.030)
SPECIFIC GRAVITY, URINE: 1.042 — AB (ref 1.005–1.030)
UROBILINOGEN UA: 0.2 mg/dL (ref 0.0–1.0)
Urobilinogen, UA: 0.2 mg/dL (ref 0.0–1.0)
pH: 7 (ref 5.0–8.0)
pH: 6 (ref 5.0–8.0)

## 2013-07-06 LAB — COMPREHENSIVE METABOLIC PANEL
ALK PHOS: 75 U/L (ref 39–117)
ALT: 20 U/L (ref 0–35)
AST: 18 U/L (ref 0–37)
Albumin: 4.2 g/dL (ref 3.5–5.2)
BUN: 10 mg/dL (ref 6–23)
CHLORIDE: 101 meq/L (ref 96–112)
CO2: 25 mEq/L (ref 19–32)
Calcium: 9.2 mg/dL (ref 8.4–10.5)
Creatinine, Ser: 0.99 mg/dL (ref 0.50–1.10)
GFR, EST AFRICAN AMERICAN: 85 mL/min — AB (ref >90)
GFR, EST NON AFRICAN AMERICAN: 74 mL/min — AB (ref >90)
Glucose, Bld: 326 mg/dL — ABNORMAL HIGH (ref 70–99)
Potassium: 4.5 mEq/L (ref 3.7–5.3)
SODIUM: 136 meq/L — AB (ref 137–147)
TOTAL PROTEIN: 7.3 g/dL (ref 6.0–8.3)
Total Bilirubin: 0.7 mg/dL (ref 0.3–1.2)

## 2013-07-06 LAB — URINE MICROSCOPIC-ADD ON

## 2013-07-06 LAB — LIPASE, BLOOD: Lipase: 30 U/L (ref 11–59)

## 2013-07-06 LAB — POCT PREGNANCY, URINE: Preg Test, Ur: NEGATIVE

## 2013-07-06 LAB — PREGNANCY, URINE: Preg Test, Ur: NEGATIVE

## 2013-07-06 MED ORDER — KETOROLAC TROMETHAMINE 60 MG/2ML IM SOLN
60.0000 mg | Freq: Once | INTRAMUSCULAR | Status: AC
  Administered 2013-07-06: 60 mg via INTRAMUSCULAR
  Filled 2013-07-06: qty 2

## 2013-07-06 MED ORDER — VALACYCLOVIR HCL 1 G PO TABS: 1000.0000 mg | ORAL_TABLET | Freq: Three times a day (TID) | ORAL | Status: AC

## 2013-07-06 MED ORDER — ONDANSETRON 8 MG PO TBDP
8.0000 mg | ORAL_TABLET | Freq: Once | ORAL | Status: AC
  Administered 2013-07-06: 8 mg via ORAL
  Filled 2013-07-06: qty 1

## 2013-07-06 MED ORDER — OXYCODONE-ACETAMINOPHEN 5-325 MG PO TABS: 1.0000 | ORAL_TABLET | ORAL | Status: DC | PRN

## 2013-07-06 MED ORDER — PREDNISONE 20 MG PO TABS: ORAL_TABLET | ORAL | Status: DC

## 2013-07-06 NOTE — ED Provider Notes (Signed)
CSN: [id number]     Arrival date & time 07/06/13  1456 History   First MD Initiated Contact with Patient 07/06/13 1618     Chief Complaint  Patient presents with  . Abdominal Pain  . Rash     (Consider location/radiation/quality/duration/timing/severity/associated sxs/prior Treatment) HPI.... complains of rash on left buttocks for several days.   Also complains of lower abdominal pain. No fever, chills, dysuria, vaginal bleeding, vaginal discharge, nausea, vomiting, diarrhea. She takes Depo-Provera for birth control. Her primary concern is a rash. Severity is mild to moderate  Past Medical History  Diagnosis Date  . Ectopic pregnancy, tubal   . H/O chlamydia infection   . H/O varicella   . H/O cystitis   . Anemia   . History of anal fissures 07/2010  . Blood transfusion 07/2010  . Blood type, Rh negative   . Low iron   . H/O: depression   . H/O insomnia   . H/O constipation   . H/O gestational diabetes mellitus, not currently pregnant   . Group B streptococcal infection in pregnancy   . BMI (body mass index) 20.0-29.9    Past Surgical History  Procedure Laterality Date  . Salpingectomy  07/2010    Right  . Wisdom tooth extraction  1998  . Ectopic pregnancy surgery  07/2010   Family History  Problem Relation Age of Onset  . Diabetes Mother   . Heart disease Mother   . Hypertension Mother   . Asthma Mother   . Anesthesia problems Neg Hx   . Hypotension Neg Hx   . Malignant hyperthermia Neg Hx   . Pseudochol deficiency Neg Hx   . Alcohol abuse Maternal Uncle   . Kidney disease Paternal Aunt   . Kidney disease Paternal Grandfather    History  Substance Use Topics  . Smoking status: Never Smoker   . Smokeless tobacco: Never Used  . Alcohol Use: No   OB History   Grav Para Term Preterm Abortions TAB SAB Ect Mult Living   3 2 2  1   1  2      Review of Systems  All other systems reviewed and are negative.      Allergies  Review of patient's allergies  indicates no known allergies.  Home Medications   Current Outpatient Rx  Name  Route  Sig  Dispense  Refill  . medroxyPROGESTERone (DEPO-PROVERA) 150 MG/ML injection   Intramuscular   Inject 150 mg into the muscle every 3 (three) months.         Marland Kitchen. oxyCODONE-acetaminophen (PERCOCET) 5-325 MG per tablet   Oral   Take 1 tablet by mouth every 4 (four) hours as needed.   20 tablet   0   . predniSONE (DELTASONE) 20 MG tablet      3 tabs po day one, then 2 po daily x 4 days   11 tablet   0   . valACYclovir (VALTREX) 1000 MG tablet   Oral   Take 1 tablet (1,000 mg total) by mouth 3 (three) times daily.   21 tablet   0    BP 133/75  Pulse 85  Temp(Src) 97.8 F (36.6 C) (Oral)  Resp 20  SpO2 100%  LMP 07/06/2013 Physical Exam  Nursing note and vitals reviewed. Constitutional: She is oriented to person, place, and time. She appears well-developed and well-nourished.  HENT:  Head: Normocephalic and atraumatic.  Eyes: Conjunctivae and EOM are normal. Pupils are equal, round, and reactive to light.  Neck: Normal range of motion. Neck supple.  Cardiovascular: Normal rate, regular rhythm and normal heart sounds.   Pulmonary/Chest: Effort normal and breath sounds normal.  Abdominal:  Minimal lower abdominal tenderness  Musculoskeletal: Normal range of motion.  Neurological: She is alert and oriented to person, place, and time.  Skin:  Left buttocks: Vesicular papular rash approximately 2 x 4 cm left lateral buttocks   Psychiatric: She has a normal mood and affect. Her behavior is normal.    ED Course  Procedures (including critical care time) Labs Review Labs Reviewed  COMPREHENSIVE METABOLIC PANEL - Abnormal; Notable for the following:    Sodium 136 (*)    Glucose, Bld 326 (*)    GFR calc non Af Amer 74 (*)    GFR calc Af Amer 85 (*)    All other components within normal limits  URINALYSIS, ROUTINE W REFLEX MICROSCOPIC - Abnormal; Notable for the following:     Specific Gravity, Urine 1.042 (*)    Glucose, UA >1000 (*)    Ketones, ur 15 (*)    All other components within normal limits  URINE MICROSCOPIC-ADD ON - Abnormal; Notable for the following:    Squamous Epithelial / LPF FEW (*)    All other components within normal limits  CBC WITH DIFFERENTIAL  LIPASE, BLOOD  PREGNANCY, URINE   Imaging Review No results found.   EKG Interpretation None      MDM   Final diagnoses:  Shingles    No acute abdomen. Clinical exam consistent with shingles on the buttocks. Rx Valtrex, prednisone, Percocet    Donnetta Hutching, MD 07/06/13 1950

## 2013-07-06 NOTE — Discharge Instructions (Signed)
Abdominal Pain, Adult °Many things can cause abdominal pain. Usually, abdominal pain is not caused by a disease and will improve without treatment. It can often be observed and treated at home. Your health care provider will do a physical exam and possibly order blood tests and X-rays to help determine the seriousness of your pain. However, in many cases, more time must pass before a clear cause of the pain can be found. Before that point, your health care provider may not know if you need more testing or further treatment. °HOME CARE INSTRUCTIONS  °Monitor your abdominal pain for any changes. The following actions may help to alleviate any discomfort you are experiencing: °· Only take over-the-counter or prescription medicines as directed by your health care provider. °· Do not take laxatives unless directed to do so by your health care provider. °· Try a clear liquid diet (broth, tea, or water) as directed by your health care provider. Slowly move to a bland diet as tolerated. °SEEK MEDICAL CARE IF: °· You have unexplained abdominal pain. °· You have abdominal pain associated with nausea or diarrhea. °· You have pain when you urinate or have a bowel movement. °· You experience abdominal pain that wakes you in the night. °· You have abdominal pain that is worsened or improved by eating food. °· You have abdominal pain that is worsened with eating fatty foods. °SEEK IMMEDIATE MEDICAL CARE IF:  °· Your pain does not go away within 2 hours. °· You have a fever. °· You keep throwing up (vomiting). °· Your pain is felt only in portions of the abdomen, such as the right side or the left lower portion of the abdomen. °· You pass bloody or black tarry stools. °MAKE SURE YOU: °· Understand these instructions.   °· Will watch your condition.   °· Will get help right away if you are not doing well or get worse.   °Document Released: 12/26/2004 Document Revised: 01/06/2013 Document Reviewed: 11/25/2012 °ExitCare® Patient  Information ©2014 [employer]. ° °

## 2013-07-06 NOTE — MAU Provider Note (Signed)
History   32yo, [id number] non-pregnant female, currently on Depo for [facility], last dose 4/1.  Pt presents with c/o rash on buttocks, urinary frequency, feelings of abdominal bloating, and LLQ abdominal pain.  Pt's last BMs were on 4/6 at 0800 and in the afternoon, reported to be hard.  Pt reports abdominal pain occurs when she lies on her stomach but is not present otherwise.  Pt has tried Motrin with no relief.   These symptoms x 2 days.  Pt noted a rash on her left buttock since last week.  Stated it started like a bug bite but has now spread.  She has been using a spray medication on the area, unsure exactly what the medication is but was for itching.    Patient Active Problem List   Diagnosis Date Noted  . Depo-Provera contraceptive status 02/10/2012  . Post partum depression 07/17/2011  . Blood type, Rh negative 07/16/2011  . Rubella 07/16/2011    Chief Complaint  Patient presents with  . Abdominal Pain  . Rash   HPI  OB History   Grav Para Term Preterm Abortions TAB SAB Ect Mult Living   3 2 2  1   1  2       Past Medical History  Diagnosis Date  . Ectopic pregnancy, tubal   . H/O chlamydia infection   . H/O varicella   . H/O cystitis   . Anemia   . History of anal fissures 07/2010  . Blood transfusion 07/2010  . Blood type, Rh negative   . Low iron   . H/O: depression   . H/O insomnia   . H/O constipation   . H/O gestational diabetes mellitus, not currently pregnant   . Group B streptococcal infection in pregnancy   . BMI (body mass index) 20.0-29.9     Past Surgical History  Procedure Laterality Date  . Salpingectomy  07/2010    Right  . Wisdom tooth extraction  1998  . Ectopic pregnancy surgery  07/2010    Family History  Problem Relation Age of Onset  . Diabetes Mother   . Heart disease Mother   . Hypertension Mother   . Asthma Mother   . Anesthesia problems Neg Hx   . Hypotension Neg Hx   . Malignant hyperthermia Neg Hx   . Pseudochol deficiency Neg Hx   .  Alcohol abuse Maternal Uncle   . Kidney disease Paternal Aunt   . Kidney disease Paternal Grandfather     History  Substance Use Topics  . Smoking status: Never Smoker   . Smokeless tobacco: Never Used  . Alcohol Use: No    Allergies: No Known Allergies  Prescriptions prior to admission  Medication Sig Dispense Refill  . medroxyPROGESTERone (DEPO-PROVERA) 150 MG/ML injection Inject 1 mL (150 mg total) into the muscle once.  1 mL  3  . sertraline (ZOLOFT) 25 MG tablet Take 1 tablet (25 mg total) by mouth daily.  30 tablet  0  . zolpidem (AMBIEN) 5 MG tablet Take 1 tablet (5 mg total) by mouth at bedtime as needed for sleep.  30 tablet  0    Review of Systems  Constitutional: Negative.   Respiratory: Negative.   Cardiovascular: Negative.   Gastrointestinal: Positive for abdominal pain. Negative for diarrhea and constipation.  Genitourinary: Positive for frequency. Negative for dysuria and urgency.  Skin: Positive for itching and rash.       On left buttock  Neurological: Negative.   Psychiatric/Behavioral: Negative.  Physical Exam   Temperature 98.2 F (36.8 C), temperature source Oral, resp. rate 16, height 5\' 4"  (1.626 m), weight 190 lb 3.2 oz (86.274 kg), last menstrual period 07/06/2013.  Physical Exam  Constitutional: She is oriented to person, place, and time. She appears well-developed and well-nourished.  Cardiovascular: Normal rate and regular rhythm.   Respiratory: Effort normal.  GI: Soft. She exhibits no mass. Bowel sounds are decreased. There is tenderness in the left lower quadrant. There is no rebound and no guarding.  Neurological: She is alert and oriented to person, place, and time.  Skin: Skin is warm and dry. Rash noted. Rash is vesicular.    ED Course  GI/GU symptoms Rash  Urine culture was sent Encouraged pt to follow up with PCP  D/c home with precautions     Haroldine LawsOXLEY, Keny Donald CNM, MSN 07/06/2013 3:11 AM

## 2013-07-06 NOTE — Discharge Instructions (Signed)
Shingles Shingles is caused by the same virus that causes chickenpox. The first feelings may be pain or tingling. A rash will follow in a couple days. The rash may occur on any area of the body. Long-lasting pain is more likely in an elderly person. It can last months to years. There are medicines that can help prevent pain if you start taking them early. HOME CARE   Place cool cloths on the rash.  Only take medicine as told by your doctor.  You may use calamine lotion to relieve itchy skin.  Avoid touching:  Babies.  Children with inflamed skin (eczema).  People who have gotten transplanted organs.  People with chronic illnesses, such as leukemia and AIDS.  If the rash is on the face, you may need to see a specialist. Keep all appointments. Shingles must be kept away from the eyes, if possible.  Keep all appointments.  Avoid touching the eyes or eye area, if possible. GET HELP RIGHT AWAY IF:   You have any pain on the face or eye.  Your medicines do not help.  Your redness or puffiness (swelling) spreads.  You have a fever.  You notice any red lines going away from the rash area. MAKE SURE YOU:   Understand these instructions.  Will watch your condition.  Will get help right away if you are not doing well or get worse. Document Released: 09/04/2007 Document Revised: 06/10/2011 Document Reviewed: 09/04/2007 [facility] Patient Information 2014 [place], [place].   Medication for pain, inflammation, antiviral.    Swelling in left groin is related to a response from the shingles. Followup your primary care Dr.

## 2013-07-06 NOTE — MAU Note (Signed)
Patient presents with complaints of itchy rash on the left side of buttock since last Saturday. It began like a bug bite then it started to spread.

## 2013-07-06 NOTE — ED Notes (Signed)
Pt reports rash since Saturday on left buttocks, now that has spread. Pt reports abdominal 7/10, denies n/v/d. [facility] not [facility] for pt and to come to ED. [facility].

## 2013-07-07 LAB — URINE CULTURE

## 2013-07-09 ENCOUNTER — Inpatient Hospital Stay (HOSPITAL_COMMUNITY)
Admission: EM | Admit: 2013-07-09 | Discharge: 2013-07-10 | DRG: 639 | Disposition: A | Discharge status: Home / Self Care | Payer: BC Managed Care – PPO | Attending: Internal Medicine | Admitting: Internal Medicine

## 2013-07-09 ENCOUNTER — Encounter (HOSPITAL_COMMUNITY): Payer: Self-pay | Admitting: Emergency Medicine

## 2013-07-09 DIAGNOSIS — T4275XA Adverse effect of unspecified antiepileptic and sedative-hypnotic drugs, initial encounter: Secondary | ICD-10-CM | POA: Diagnosis present

## 2013-07-09 DIAGNOSIS — E8729 Other acidosis: Secondary | ICD-10-CM

## 2013-07-09 DIAGNOSIS — K5900 Constipation, unspecified: Secondary | ICD-10-CM | POA: Diagnosis present

## 2013-07-09 DIAGNOSIS — E872 Acidosis: Secondary | ICD-10-CM

## 2013-07-09 DIAGNOSIS — IMO0002 Reserved for concepts with insufficient information to code with codable children: Secondary | ICD-10-CM

## 2013-07-09 DIAGNOSIS — Z8632 Personal history of gestational diabetes: Secondary | ICD-10-CM

## 2013-07-09 DIAGNOSIS — R739 Hyperglycemia, unspecified: Secondary | ICD-10-CM

## 2013-07-09 DIAGNOSIS — E1310 Other specified diabetes mellitus with ketoacidosis without coma: Principal | ICD-10-CM | POA: Diagnosis present

## 2013-07-09 DIAGNOSIS — B029 Zoster without complications: Secondary | ICD-10-CM

## 2013-07-09 DIAGNOSIS — E1110 Type 2 diabetes mellitus with ketoacidosis without coma: Secondary | ICD-10-CM

## 2013-07-09 LAB — CBC WITH DIFFERENTIAL/PLATELET
Basophils Absolute: 0 K/uL (ref 0.0–0.1)
Basophils Relative: 0 % (ref 0–1)
Eosinophils Absolute: 0 K/uL (ref 0.0–0.7)
Eosinophils Relative: 0 % (ref 0–5)
HCT: 43.1 % (ref 36.0–46.0)
Hemoglobin: 15.1 g/dL — ABNORMAL HIGH (ref 12.0–15.0)
Lymphocytes Relative: 8 % — ABNORMAL LOW (ref 12–46)
Lymphs Abs: 0.9 K/uL (ref 0.7–4.0)
MCH: 32.3 pg (ref 26.0–34.0)
MCHC: 35 g/dL (ref 30.0–36.0)
MCV: 92.3 fL (ref 78.0–100.0)
Monocytes Absolute: 0.3 K/uL (ref 0.1–1.0)
Monocytes Relative: 3 % (ref 3–12)
Neutro Abs: 9.4 K/uL — ABNORMAL HIGH (ref 1.7–7.7)
Neutrophils Relative %: 89 % — ABNORMAL HIGH (ref 43–77)
Platelets: 214 K/uL (ref 150–400)
RBC: 4.67 MIL/uL (ref 3.87–5.11)
RDW: 11.9 % (ref 11.5–15.5)
WBC: 10.6 K/uL — ABNORMAL HIGH (ref 4.0–10.5)

## 2013-07-09 LAB — COMPREHENSIVE METABOLIC PANEL WITH GFR
ALT: 32 U/L (ref 0–35)
AST: 21 U/L (ref 0–37)
Albumin: 4.5 g/dL (ref 3.5–5.2)
Alkaline Phosphatase: 100 U/L (ref 39–117)
BUN: 18 mg/dL (ref 6–23)
CO2: 20 meq/L (ref 19–32)
Calcium: 9.9 mg/dL (ref 8.4–10.5)
Chloride: 88 meq/L — ABNORMAL LOW (ref 96–112)
Creatinine, Ser: 1.04 mg/dL (ref 0.50–1.10)
GFR calc Af Amer: 80 mL/min — ABNORMAL LOW
GFR calc non Af Amer: 69 mL/min — ABNORMAL LOW
Glucose, Bld: 853 mg/dL (ref 70–99)
Potassium: 4.9 meq/L (ref 3.7–5.3)
Sodium: 128 meq/L — ABNORMAL LOW (ref 137–147)
Total Bilirubin: 0.6 mg/dL (ref 0.3–1.2)
Total Protein: 8 g/dL (ref 6.0–8.3)

## 2013-07-09 LAB — URINALYSIS, ROUTINE W REFLEX MICROSCOPIC
Bilirubin Urine: NEGATIVE
Ketones, ur: NEGATIVE mg/dL
LEUKOCYTES UA: NEGATIVE
NITRITE: NEGATIVE
PROTEIN: NEGATIVE mg/dL
Specific Gravity, Urine: <1.005 — ABNORMAL LOW (ref 1.005–1.030)
Urobilinogen, UA: 0.2 mg/dL (ref 0.0–1.0)
pH: 5 (ref 5.0–8.0)

## 2013-07-09 LAB — PREGNANCY, URINE: Preg Test, Ur: NEGATIVE

## 2013-07-09 LAB — CBG MONITORING, ED: Glucose-Capillary: >600 mg/dL (ref 70–99)

## 2013-07-09 LAB — URINE MICROSCOPIC-ADD ON

## 2013-07-09 MED ORDER — SODIUM CHLORIDE 0.9 % IV SOLN
1000.0000 mL | Freq: Once | INTRAVENOUS | Status: AC
  Administered 2013-07-09: 1000 mL via INTRAVENOUS

## 2013-07-09 MED ORDER — SODIUM CHLORIDE 0.9 % IV SOLN
1000.0000 mL | Freq: Once | INTRAVENOUS | Status: AC
  Administered 2013-07-10: 1000 mL via INTRAVENOUS

## 2013-07-09 MED ORDER — SODIUM CHLORIDE 0.9 % IV SOLN
1000.0000 mL | INTRAVENOUS | Status: DC
  Administered 2013-07-10: 1000 mL via INTRAVENOUS

## 2013-07-09 NOTE — ED Notes (Signed)
Pt c/o a dry mouth urinary frequency and abd pain for 3 days.  lmp  None depo  Shot.  Some vaginal spotting intermittently.  No nv or diarrrhea

## 2013-07-10 ENCOUNTER — Inpatient Hospital Stay (HOSPITAL_COMMUNITY): Payer: BC Managed Care – PPO

## 2013-07-10 ENCOUNTER — Encounter (HOSPITAL_COMMUNITY): Payer: Self-pay | Admitting: *Deleted

## 2013-07-10 DIAGNOSIS — B029 Zoster without complications: Secondary | ICD-10-CM

## 2013-07-10 DIAGNOSIS — E872 Acidosis, unspecified: Secondary | ICD-10-CM

## 2013-07-10 DIAGNOSIS — R7309 Other abnormal glucose: Secondary | ICD-10-CM

## 2013-07-10 DIAGNOSIS — E1310 Other specified diabetes mellitus with ketoacidosis without coma: Principal | ICD-10-CM

## 2013-07-10 DIAGNOSIS — E1110 Type 2 diabetes mellitus with ketoacidosis without coma: Secondary | ICD-10-CM | POA: Diagnosis present

## 2013-07-10 DIAGNOSIS — R739 Hyperglycemia, unspecified: Secondary | ICD-10-CM | POA: Diagnosis present

## 2013-07-10 LAB — COMPREHENSIVE METABOLIC PANEL
ALBUMIN: 3.6 g/dL (ref 3.5–5.2)
ALT: 26 U/L (ref 0–35)
AST: 18 U/L (ref 0–37)
Alkaline Phosphatase: 67 U/L (ref 39–117)
BUN: 13 mg/dL (ref 6–23)
CALCIUM: 8.2 mg/dL — AB (ref 8.4–10.5)
CO2: 21 mEq/L (ref 19–32)
Chloride: 103 mEq/L (ref 96–112)
Creatinine, Ser: 0.88 mg/dL (ref 0.50–1.10)
GFR calc Af Amer: >90 mL/min (ref >90)
GFR, EST NON AFRICAN AMERICAN: 85 mL/min — AB (ref >90)
Glucose, Bld: 321 mg/dL — ABNORMAL HIGH (ref 70–99)
Potassium: 4.3 mEq/L (ref 3.7–5.3)
SODIUM: 139 meq/L (ref 137–147)
Total Bilirubin: 0.5 mg/dL (ref 0.3–1.2)
Total Protein: 6.4 g/dL (ref 6.0–8.3)

## 2013-07-10 LAB — BASIC METABOLIC PANEL
BUN: 11 mg/dL (ref 6–23)
CALCIUM: 8.3 mg/dL — AB (ref 8.4–10.5)
CO2: 22 mEq/L (ref 19–32)
CREATININE: 0.91 mg/dL (ref 0.50–1.10)
Chloride: 105 mEq/L (ref 96–112)
GFR calc non Af Amer: 81 mL/min — ABNORMAL LOW (ref >90)
Glucose, Bld: 161 mg/dL — ABNORMAL HIGH (ref 70–99)
Potassium: 4 mEq/L (ref 3.7–5.3)
Sodium: 141 mEq/L (ref 137–147)

## 2013-07-10 LAB — CBC WITH DIFFERENTIAL/PLATELET
BASOS ABS: 0 10*3/uL (ref 0.0–0.1)
BASOS PCT: 0 % (ref 0–1)
Eosinophils Absolute: 0 10*3/uL (ref 0.0–0.7)
Eosinophils Relative: 0 % (ref 0–5)
HCT: 37.7 % (ref 36.0–46.0)
Hemoglobin: 13.1 g/dL (ref 12.0–15.0)
Lymphocytes Relative: 22 % (ref 12–46)
Lymphs Abs: 2.1 10*3/uL (ref 0.7–4.0)
MCH: 31.6 pg (ref 26.0–34.0)
MCHC: 34.7 g/dL (ref 30.0–36.0)
MCV: 90.8 fL (ref 78.0–100.0)
Monocytes Absolute: 0.6 10*3/uL (ref 0.1–1.0)
Monocytes Relative: 6 % (ref 3–12)
NEUTROS ABS: 6.7 10*3/uL (ref 1.7–7.7)
NEUTROS PCT: 72 % (ref 43–77)
PLATELETS: 200 10*3/uL (ref 150–400)
RBC: 4.15 MIL/uL (ref 3.87–5.11)
RDW: 11.6 % (ref 11.5–15.5)
WBC: 9.4 10*3/uL (ref 4.0–10.5)

## 2013-07-10 LAB — GLUCOSE, CAPILLARY
GLUCOSE-CAPILLARY: 216 mg/dL — AB (ref 70–99)
Glucose-Capillary: 209 mg/dL — ABNORMAL HIGH (ref 70–99)

## 2013-07-10 LAB — CBG MONITORING, ED
GLUCOSE-CAPILLARY: 452 mg/dL — AB (ref 70–99)
GLUCOSE-CAPILLARY: 335 mg/dL — AB (ref 70–99)

## 2013-07-10 LAB — HEMOGLOBIN A1C
Hgb A1c MFr Bld: 10 % — ABNORMAL HIGH (ref <5.7)
MEAN PLASMA GLUCOSE: 240 mg/dL — AB (ref <117)

## 2013-07-10 MED ORDER — ONDANSETRON HCL 4 MG PO TABS
4.0000 mg | ORAL_TABLET | Freq: Four times a day (QID) | ORAL | Status: DC | PRN
Start: 2013-07-10 — End: 2013-07-10

## 2013-07-10 MED ORDER — ACETAMINOPHEN 650 MG RE SUPP
650.0000 mg | Freq: Four times a day (QID) | RECTAL | Status: DC | PRN
Start: 2013-07-10 — End: 2013-07-10

## 2013-07-10 MED ORDER — ONDANSETRON HCL 4 MG/2ML IJ SOLN: 4.0000 mg | Freq: Four times a day (QID) | INTRAMUSCULAR | Status: DC | PRN

## 2013-07-10 MED ORDER — ACETAMINOPHEN 325 MG PO TABS: 650.0000 mg | ORAL_TABLET | Freq: Four times a day (QID) | ORAL | Status: DC | PRN

## 2013-07-10 MED ORDER — FINGERSTIX LANCETS MISC: Status: DC

## 2013-07-10 MED ORDER — INSULIN ASPART 100 UNIT/ML ~~LOC~~ SOLN: 0.0000 [IU] | Freq: Every day | SUBCUTANEOUS | Status: DC

## 2013-07-10 MED ORDER — OXYCODONE-ACETAMINOPHEN 5-325 MG PO TABS: 1.0000 | ORAL_TABLET | ORAL | Status: DC | PRN

## 2013-07-10 MED ORDER — DEXTROSE-NACL 5-0.45 % IV SOLN: INTRAVENOUS | Status: DC

## 2013-07-10 MED ORDER — DEXTROSE 50 % IV SOLN: 25.0000 mL | INTRAVENOUS | Status: DC | PRN

## 2013-07-10 MED ORDER — SODIUM CHLORIDE 0.9 % IV SOLN
INTRAVENOUS | Status: DC
  Filled 2013-07-10: qty 1

## 2013-07-10 MED ORDER — GLUCOSE BLOOD VI STRP: ORAL_STRIP | Status: DC

## 2013-07-10 MED ORDER — SODIUM CHLORIDE 0.9 % IJ SOLN
3.0000 mL | Freq: Two times a day (BID) | INTRAMUSCULAR | Status: DC
Start: 2013-07-10 — End: 2013-07-10

## 2013-07-10 MED ORDER — METFORMIN HCL 500 MG PO TABS: 500.0000 mg | ORAL_TABLET | Freq: Two times a day (BID) | ORAL | Status: DC

## 2013-07-10 MED ORDER — DOCUSATE SODIUM 100 MG PO CAPS
100.0000 mg | ORAL_CAPSULE | Freq: Two times a day (BID) | ORAL | Status: DC
  Administered 2013-07-10: 100 mg via ORAL
  Filled 2013-07-10 (×2): qty 1

## 2013-07-10 MED ORDER — FREESTYLE SYSTEM KIT: PACK | Status: DC

## 2013-07-10 MED ORDER — HEPARIN SODIUM (PORCINE) 5000 UNIT/ML IJ SOLN
5000.0000 [IU] | Freq: Three times a day (TID) | INTRAMUSCULAR | Status: DC
  Filled 2013-07-10 (×3): qty 1

## 2013-07-10 MED ORDER — VALACYCLOVIR HCL 500 MG PO TABS
1000.0000 mg | ORAL_TABLET | Freq: Three times a day (TID) | ORAL | Status: DC
  Administered 2013-07-10: 1000 mg via ORAL
  Filled 2013-07-10 (×3): qty 2

## 2013-07-10 MED ORDER — INSULIN GLARGINE 100 UNIT/ML ~~LOC~~ SOLN
16.0000 [IU] | Freq: Every day | SUBCUTANEOUS | Status: DC
  Administered 2013-07-10: 16 [IU] via SUBCUTANEOUS
  Filled 2013-07-10 (×2): qty 0.16

## 2013-07-10 MED ORDER — INSULIN REGULAR BOLUS VIA INFUSION: 0.0000 [IU] | Freq: Three times a day (TID) | INTRAVENOUS | Status: DC

## 2013-07-10 MED ORDER — SODIUM CHLORIDE 0.9 % IV BOLUS (SEPSIS)
1000.0000 mL | Freq: Once | INTRAVENOUS | Status: AC
  Administered 2013-07-10: 1000 mL via INTRAVENOUS

## 2013-07-10 MED ORDER — INSULIN ASPART 100 UNIT/ML ~~LOC~~ SOLN
0.0000 [IU] | Freq: Four times a day (QID) | SUBCUTANEOUS | Status: DC
  Administered 2013-07-10: 11 [IU] via SUBCUTANEOUS
  Filled 2013-07-10: qty 1

## 2013-07-10 MED ORDER — INSULIN ASPART 100 UNIT/ML ~~LOC~~ SOLN: 0.0000 [IU] | Freq: Four times a day (QID) | SUBCUTANEOUS | Status: DC

## 2013-07-10 MED ORDER — INSULIN PEN NEEDLE 32G X 6 MM MISC: Status: DC

## 2013-07-10 MED ORDER — INSULIN DETEMIR 100 UNIT/ML FLEXPEN: 10.0000 [IU] | PEN_INJECTOR | Freq: Every day | SUBCUTANEOUS | Status: DC

## 2013-07-10 MED ORDER — POLYETHYLENE GLYCOL 3350 17 G PO PACK: 17.0000 g | PACK | Freq: Every day | ORAL | Status: AC

## 2013-07-10 MED ORDER — SODIUM CHLORIDE 0.9 % IV SOLN: INTRAVENOUS | Status: DC

## 2013-07-10 MED ORDER — INSULIN ASPART 100 UNIT/ML ~~LOC~~ SOLN
0.0000 [IU] | Freq: Four times a day (QID) | SUBCUTANEOUS | Status: DC
  Administered 2013-07-10 (×2): 5 [IU] via SUBCUTANEOUS

## 2013-07-10 MED ORDER — INSULIN ASPART 100 UNIT/ML ~~LOC~~ SOLN: 8.0000 [IU] | Freq: Once | SUBCUTANEOUS | Status: DC

## 2013-07-10 NOTE — H&P (Signed)
[facility] History and Physical  Patient: Susan Hale  WUJ:[id number]  DOB: [birth year]  DOS: the patient was seen and examined on 07/10/2013 PCP: Purcell Nails, MD  Chief Complaint: Polyuria  HPI: Susan Hale is a 33 y.o. female with Past medical history of ectopic pregnancy, gestational diabetes mellitus. The patient presented with complaints of polyuria and blurred vision ongoing since last for 4 days. She was seen in the for the complaint of abdominal pain and rash at which time she was diagnosed with shingles on buttocks and was prescribed Valtrex prednisone and Percocet. She continued taking this medication and till today. She mentions that she has been having this abdominal pain in her left lower quadrant since last one week. He has history of constipation and has not had any bowel movement since last few days. She started having complaints of polyuria without any burning urination or active bleeding. Denies any fever or chills nausea or vomiting chest pain or shortness of breath focal neurological deficit. She complains of some blurring of her vision and dry mouth. She has history of gestational diabetes mellitus with abnormal 1 hour and 3 hour GTT and HbA1c of 6.5 in 2013.  The patient is coming from home. And at her baseline Independent for most of her  ADL.  Review of Systems: as mentioned in the history of present illness.  A Comprehensive review of the other systems is negative.  Past Medical History  Diagnosis Date  . Ectopic pregnancy, tubal   . H/O chlamydia infection   . H/O varicella   . H/O cystitis   . Anemia   . History of anal fissures 07/2010  . Blood transfusion 07/2010  . Blood type, Rh negative   . Low iron   . H/O: depression   . H/O insomnia   . H/O constipation   . H/O gestational diabetes mellitus, not currently pregnant   . Group B streptococcal infection in pregnancy   . BMI (body mass index) 20.0-29.9    Past Surgical History   Procedure Laterality Date  . Salpingectomy  07/2010    Right  . Wisdom tooth extraction  1998  . Ectopic pregnancy surgery  07/2010   Social History:  reports that she has never smoked. She has never used smokeless tobacco. She reports that she does not drink alcohol or use illicit drugs.  No Known Allergies  Family History  Problem Relation Age of Onset  . Diabetes Mother   . Heart disease Mother   . Hypertension Mother   . Asthma Mother   . Anesthesia problems Neg Hx   . Hypotension Neg Hx   . Malignant hyperthermia Neg Hx   . Pseudochol deficiency Neg Hx   . Alcohol abuse Maternal Uncle   . Kidney disease Paternal Aunt   . Kidney disease Paternal Grandfather     Prior to Admission medications   Medication Sig Start Date End Date Taking? Authorizing Provider  medroxyPROGESTERone (DEPO-PROVERA) 150 MG/ML injection Inject 150 mg into the muscle every 3 (three) months.   Yes Historical Provider, MD  oxyCODONE-acetaminophen (PERCOCET) 5-325 MG per tablet Take 1 tablet by mouth every 4 (four) hours as needed. 07/06/13  Yes Donnetta Hutching, MD  predniSONE (DELTASONE) 20 MG tablet 3 tabs po day one, then 2 po daily x 4 days 07/06/13  Yes Donnetta Hutching, MD  valACYclovir (VALTREX) 1000 MG tablet Take 1 tablet (1,000 mg total) by mouth 3 (three) times daily. 07/06/13 07/20/13 Yes Donnetta Hutching, MD  Physical Exam: Filed Vitals:   07/10/13 0245 07/10/13 0300 07/10/13 0315 07/10/13 0330  BP: 124/83 132/70 126/74 129/73  Pulse: 77 77 69 69  Temp:      Resp:      Height:      Weight:      SpO2: 100% 100% 100% 100%    General: Alert, Awake and Oriented to Time, Place and Person. Appear in mild distress Eyes: PERRL ENT: Oral Mucosa clear moist. Neck: No JVD Cardiovascular: S1 and S2 Present, no Murmur, Peripheral Pulses Present Respiratory: Bilateral Air entry equal and Decreased, Clear to Auscultation,  No Crackles, no wheezes Abdomen: Bowel Sound Present, Soft and Non tender Skin: No  Rash Extremities: No Pedal edema, no calf tenderness Neurologic: Grossly Unremarkable.  Labs on Admission:  CBC:  Recent Labs Lab 07/06/13 1540 07/09/13 2132 07/10/13 0357  WBC 5.0 10.6* 9.4  NEUTROABS 2.7 9.4* 6.7  HGB 15.0 15.1* 13.1  HCT 43.2 43.1 37.7  MCV 90.9 92.3 90.8  PLT 206 214 200    CMP     Component Value Date/Time   NA 128* 07/09/2013 2132   K 4.9 07/09/2013 2132   CL 88* 07/09/2013 2132   CO2 20 07/09/2013 2132   GLUCOSE 853* 07/09/2013 2132   BUN 18 07/09/2013 2132   CREATININE 1.04 07/09/2013 2132   CALCIUM 9.9 07/09/2013 2132   PROT 8.0 07/09/2013 2132   ALBUMIN 4.5 07/09/2013 2132   AST 21 07/09/2013 2132   ALT 32 07/09/2013 2132   ALKPHOS 100 07/09/2013 2132   BILITOT 0.6 07/09/2013 2132   GFRNONAA 69* 07/09/2013 2132   GFRAA 80* 07/09/2013 2132     Recent Labs Lab 07/06/13 1540  LIPASE 30   No results found for this basename: AMMONIA,  in the last 168 hours  No results found for this basename: CKTOTAL, CKMB, CKMBINDEX, TROPONINI,  in the last 168 hours BNP (last 3 results) No results found for this basename: PROBNP,  in the last 8760 hours  Radiological Exams on Admission: No results found.   Assessment/Plan Principal Problem:   DKA (diabetic ketoacidoses) Active Problems:   Hyperglycemia   1. DKA (diabetic ketoacidoses) The patient is presenting with complaints of polyuria blurring of vision and abdominal pain. She was recently started on steroid medications. She presented with hyperglycemia and anion gap of 20, a urine did not show any evidence of ketones. But she did have complaint of abdominal pain and low bicarbonate which suggest presence of DKA. With IV hydration her blood glucose improved rapidly from 800+ to 300+. With this sudden correction at present I would hold off on placing the patient on IV insulin drip. A repeat CMP also does not show any significant anion gap. Patient will be started on subcutaneous insulin protocol every 4  hours and will be placed on diabetic diet. I will give her insulin Lantus 0.2 mg per kilogram as he has significantly elevated HbA1c which will require insulin therapy from the initial phase. Patient will need diabetic education and insulin education. Continue with IV hydration I would continue to monitor her BMP every 4 hour x2 to make sure anion gap closure.   2.Shingles  Continue Valtrex and Percocet   3.Constipation  Stool softener  DVT Prophylaxis: subcutaneous Heparin Nutrition: Diabetic diet  Code Status: Full  Family Communication: Family  was present at bedside, opportunity was given to ask question and all questions were answered satisfactorily at the time of interview. Disposition: Admitted to inpatient in telemetry  unit.  Author: Lynden Oxford, MD [facility] Pager: [phone] 07/10/2013, 4:23 AM    If 7PM-7AM, please contact night-coverage [email or web address] Password [email or web address]

## 2013-07-10 NOTE — Discharge Summary (Signed)
Physician Discharge Summary  Susan Hale FAO:[id number] DOB: 1980-09-08 DOA: 07/09/2013  PCP: Delice Lesch, MD  Admit date: 07/09/2013 Discharge date: 07/10/2013  Time spent: >30 minutes  Recommendations for Outpatient Follow-up:  BMET to follow electrolytes and renal function Close follow up to CBG's and DM, to further adjust hypoglycemic regimen Patient needs to follow with ophthalmology  Discharge Diagnoses:  Principal Problem:   DKA (diabetic ketoacidoses) Active Problems:   Hyperglycemia Constipation   Uncontrolled type 2 DM Blurred vision  Discharge Condition: stable and improved.  Diet recommendation: low carbohydrates diet   Filed Weights   07/09/13 2128 07/10/13 0430  Weight: 83.008 kg (183 lb) 84.959 kg (187 lb 4.8 oz)    History of present illness:  33 y.o. female with Past medical history of ectopic pregnancy, gestational diabetes mellitus.  The patient presented with complaints of polyuria and blurred vision ongoing since last for 4 days. She was seen in the for the complaint of abdominal pain and rash at which time she was diagnosed with shingles on buttocks and was prescribed Valtrex prednisone and Percocet. She continued taking this medication and till today. She mentions that she has been having this abdominal pain in her left lower quadrant since last one week. He has history of constipation and has not had any bowel movement since last few days.  She started having complaints of polyuria without any burning urination or active bleeding. Denies any fever or chills nausea or vomiting chest pain or shortness of breath focal neurological deficit. She complains of some blurring of her vision and dry mouth.  She has history of gestational diabetes mellitus with abnormal 1 hour and 3 hour GTT and HbA1c of 6.5 in 2013.  Hospital Course:  1-DKA type 2: patient DKA resolved at discharge. Bicarb 22, CBG's continuously < 200 and anion gap essentially  closed. -patient has been discharge on lantus 10units daily and metformin BID -needs close follow up with PCP to further adjust hypoglycemic regimen -naive to insulin treatment  2-herpes zoster: healing/resolving -continue valtrex as prescribed -no more steroids  3-uncontrolled DM: patient started on insulin and metformin -advise to follow low carbohydrates diet -to check CBG;s three times a day and to establish care with a PCP -A1C 10.1  4-hx of constipation: most likely due to use of narcotics. -discharged on miralax.  5-blurred vision: essentially resolved according to patient. Most likely due to hyperglycemia. -on admission CBG's in the 850 range -advised to see and opthalmology as an outpatient   Procedures:  See below for x-ray reports  Consultations:  None   Discharge Exam: Filed Vitals:   07/10/13 0430  BP: 142/75  Pulse: 78  Temp: 98.1 F (36.7 C)  Resp: 16    General: NAD, afebrile, no CP or SOB. Patient denies N/V Cardiovascular: S1 and S2, no rubs or gallops Respiratory: CTA bilaterally Abdomen: soft, NT, ND, positive BS Skin: left flank/decubitus area with resolving vesicular/crusting rash  Discharge Instructions You were cared for by a hospitalist during your hospital stay. If you have any questions about your discharge medications or the care you received while you were in the hospital after you are discharged, you can call the unit and asked to speak with the hospitalist on call if the hospitalist that took care of you is not available. Once you are discharged, your primary care physician will handle any further medical issues. Please note that NO REFILLS for any discharge medications will be authorized once you are discharged, as it is  imperative that you return to your primary care physician (or establish a relationship with a primary care physician if you do not have one) for your aftercare needs so that they can reassess your need for medications and  monitor your lab values.  Discharge Orders   Future Orders Complete By Expires   Discharge instructions  As directed        Medication List    STOP taking these medications       predniSONE 20 MG tablet  Commonly known as:  DELTASONE      TAKE these medications       FINGERSTIX LANCETS Misc  Use to check blood sugar three times a day     glucose blood test strip  Use to check blood sugar three times a day     glucose monitoring kit monitoring kit  Use to check blood sugar three times a day     Insulin Detemir 100 UNIT/ML Pen  Commonly known as:  LEVEMIR FLEXTOUCH  Inject 10 Units into the skin daily at 10 pm.     Insulin Pen Needle 32G X 6 MM Misc  To use with insulin pen to inject insulin every 24 hours a     medroxyPROGESTERone 150 MG/ML injection  Commonly known as:  DEPO-PROVERA  Inject 150 mg into the muscle every 3 (three) months.     metFORMIN 500 MG tablet  Commonly known as:  GLUCOPHAGE  Take 1 tablet (500 mg total) by mouth 2 (two) times daily with a meal.     oxyCODONE-acetaminophen 5-325 MG per tablet  Commonly known as:  PERCOCET  Take 1 tablet by mouth every 4 (four) hours as needed.     polyethylene glycol packet  Commonly known as:  MIRALAX / GLYCOLAX  Take 17 g by mouth daily.     valACYclovir 1000 MG tablet  Commonly known as:  VALTREX  Take 1 tablet (1,000 mg total) by mouth 3 (three) times daily.       No Known Allergies    The results of significant diagnostics from this hospitalization (including imaging, microbiology, ancillary and laboratory) are listed below for reference.    Significant Diagnostic Studies: Dg Abd 2 Views  07/10/2013   CLINICAL DATA:  Left lower quadrant pain.  EXAM: ABDOMEN - 2 VIEW  COMPARISON:  None.  FINDINGS: Stool-filled colon. No small or large bowel distention. There is no evidence of free air. No radio-opaque calculi or other significant radiographic abnormality is seen.  IMPRESSION: Stool-filled colon.   No evidence of obstruction.   Electronically Signed   By: Lucienne Capers M.D.   On: 07/10/2013 04:39    Microbiology: Recent Results (from the past 240 hour(s))  URINE CULTURE     Status: None   Collection Time    07/06/13  2:14 AM      Result Value Ref Range Status   Specimen Description URINE, CLEAN CATCH   Final   Special Requests NONE   Final   Culture  Setup Time     Final   Value: 07/06/2013 08:35     Performed at [facility]     Final   Value: 50,000 COLONIES/ML     Performed at [facility]   Culture     Final   Value: Multiple bacterial morphotypes present, none predominant. Suggest appropriate recollection if clinically indicated.     Performed at [facility]   Report Status 07/07/2013 FINAL  Final  VIRAL CULTURE [facility]     Status: None   Collection Time    07/06/13  2:58 AM      Result Value Ref Range Status   Specimen Description BUTTOCKS   Final   Special Requests NONE   Final   Culture     Final   Value: CONTINUING TO HOLD     Note:    The current Enterovirus culture system does not detect Enterovirus D68. If Enterovirus D68 is suspected, please call client services to add the test for Enterovirus PCR (Test code [id number]).     Performed at [facility]   Report Status PENDING   Incomplete     Labs: Basic Metabolic Panel:  Recent Labs Lab 07/06/13 1540 07/09/13 2132 07/10/13 0357 07/10/13 1015  NA 136* 128* 139 141  K 4.5 4.9 4.3 4.0  CL 101 88* 103 105  CO2 '25 20 21 22  ' GLUCOSE 326* 853* 321* 161*  BUN '10 18 13 11  ' CREATININE 0.99 1.04 0.88 0.91  CALCIUM 9.2 9.9 8.2* 8.3*   Liver Function Tests:  Recent Labs Lab 07/06/13 1540 07/09/13 2132 07/10/13 0357  AST '18 21 18  ' ALT 20 32 26  ALKPHOS 75 100 67  BILITOT 0.7 0.6 0.5  PROT 7.3 8.0 6.4  ALBUMIN 4.2 4.5 3.6    Recent Labs Lab 07/06/13 1540  LIPASE 30   CBC:  Recent Labs Lab 07/06/13 1540 07/09/13 2132 07/10/13 0357  WBC 5.0  10.6* 9.4  NEUTROABS 2.7 9.4* 6.7  HGB 15.0 15.1* 13.1  HCT 43.2 43.1 37.7  MCV 90.9 92.3 90.8  PLT 206 214 200   CBG:  Recent Labs Lab 07/09/13 2139 07/10/13 0139 07/10/13 0248 07/10/13 0628 07/10/13 1221  GLUCAP >600* 452* 335* 216* 209*    Signed:  Barton Dubois  [facility] 07/10/2013, 12:47 PM

## 2013-07-10 NOTE — Progress Notes (Signed)
Patient arrived to the unit from ED via stretcher. Patient alert, oriented and ambulatory. Admission weight, vitals and assessment completed. Fall and safety plan reviewed with patient.  Patient currently resting comfortably, call light within reach-will continue to monitor. Blood pressure 142/75, pulse 78, temperature 98.1 F (36.7 C), temperature source Oral, resp. rate 16, height 5\' 4"  (1.626 m), weight 84.959 kg (187 lb 4.8 oz), last menstrual period 07/06/2013, SpO2 100.00%. Susan Hale

## 2013-07-10 NOTE — ED Provider Notes (Signed)
CSN: [id number]     Arrival date & time 07/09/13  2103 History   First MD Initiated Contact with Patient 07/09/13 2325     Chief Complaint  Patient presents with  . urinary frequency      (Consider location/radiation/quality/duration/timing/severity/associated sxs/prior Treatment) HPI 33 yo female presents to the ER from home with complaint of dry mouth, increased urination and blurred vision along with LUQ abd pain.  LUQ pain ongoing for 3-4 days, also has painful rash to left buttock x 1 week.  Other symptoms ongoing for just today.  Pt was seen at [facility] on 4/7, dx with shingles and started on valtrex and prednisone, percocet.  Pt has h/o GDM, constipation.  Reports GDM tx with diet only, no problems since.  BMP from 4/7 with elevated glucose at 326.  Past Medical History  Diagnosis Date  . Ectopic pregnancy, tubal   . H/O chlamydia infection   . H/O varicella   . H/O cystitis   . Anemia   . History of anal fissures 07/2010  . Blood transfusion 07/2010  . Blood type, Rh negative   . Low iron   . H/O: depression   . H/O insomnia   . H/O constipation   . H/O gestational diabetes mellitus, not currently pregnant   . Group B streptococcal infection in pregnancy   . BMI (body mass index) 20.0-29.9    Past Surgical History  Procedure Laterality Date  . Salpingectomy  07/2010    Right  . Wisdom tooth extraction  1998  . Ectopic pregnancy surgery  07/2010   Family History  Problem Relation Age of Onset  . Diabetes Mother   . Heart disease Mother   . Hypertension Mother   . Asthma Mother   . Anesthesia problems Neg Hx   . Hypotension Neg Hx   . Malignant hyperthermia Neg Hx   . Pseudochol deficiency Neg Hx   . Alcohol abuse Maternal Uncle   . Kidney disease Paternal Aunt   . Kidney disease Paternal Grandfather    History  Substance Use Topics  . Smoking status: Never Smoker   . Smokeless tobacco: Never Used  . Alcohol Use: No   OB History   Grav Para Term  Preterm Abortions TAB SAB Ect Mult Living   3 2 2  1   1  2      Review of Systems  All other systems reviewed and are negative.   Other than listed in HPI  Allergies  Review of patient's allergies indicates no known allergies.  Home Medications   Current Outpatient Rx  Name  Route  Sig  Dispense  Refill  . medroxyPROGESTERone (DEPO-PROVERA) 150 MG/ML injection   Intramuscular   Inject 150 mg into the muscle every 3 (three) months.         Marland Kitchen oxyCODONE-acetaminophen (PERCOCET) 5-325 MG per tablet   Oral   Take 1 tablet by mouth every 4 (four) hours as needed.   20 tablet   0   . predniSONE (DELTASONE) 20 MG tablet      3 tabs po day one, then 2 po daily x 4 days   11 tablet   0   . valACYclovir (VALTREX) 1000 MG tablet   Oral   Take 1 tablet (1,000 mg total) by mouth 3 (three) times daily.   21 tablet   0    BP 131/85  Pulse 84  Temp(Src) 98.4 F (36.9 C)  Resp 18  Ht 5'  4" (1.626 m)  Wt 183 lb (83.008 kg)  BMI 31.40 kg/m2  SpO2 98%  LMP 07/06/2013 Physical Exam  Vitals reviewed. Constitutional: She is oriented to person, place, and time. She appears well-developed and well-nourished.  HENT:  Head: Normocephalic and atraumatic.  Right Ear: External ear normal.  Left Ear: External ear normal.  Nose: Nose normal.  Mouth/Throat: Oropharynx is clear and moist.  Dry mucous membranes  Eyes: Conjunctivae and EOM are normal. Pupils are equal, round, and reactive to light.  Neck: Normal range of motion. Neck supple. No JVD present. No tracheal deviation present. No thyromegaly present.  Cardiovascular: Normal rate, regular rhythm, normal heart sounds and intact distal pulses.  Exam reveals no gallop and no friction rub.   No murmur heard. Pulmonary/Chest: Effort normal and breath sounds normal. No stridor. No respiratory distress. She has no wheezes. She has no rales. She exhibits no tenderness.  Abdominal: Soft. Bowel sounds are normal. She exhibits no  distension and no mass. There is tenderness (mild LUQ/LLQ pain without rebound or guarding). There is no rebound and no guarding.  Musculoskeletal: Normal range of motion. She exhibits no edema and no tenderness.  Lymphadenopathy:    She has no cervical adenopathy.  Neurological: She is alert and oriented to person, place, and time. No cranial nerve deficit. She exhibits normal muscle tone. Coordination normal.  Skin: Skin is warm and dry. Rash noted. No erythema. No pallor.  Psychiatric: She has a normal mood and affect. Her behavior is normal. Judgment and thought content normal.    ED Course  Procedures (including critical care time) Labs Review Labs Reviewed  URINALYSIS, ROUTINE W REFLEX MICROSCOPIC - Abnormal; Notable for the following:    Color, Urine STRAW (*)    Specific Gravity, Urine <1.005 (*)    Glucose, UA >1000 (*)    Hgb urine dipstick TRACE (*)    All other components within normal limits  CBC WITH DIFFERENTIAL - Abnormal; Notable for the following:    WBC 10.6 (*)    Hemoglobin 15.1 (*)    Neutrophils Relative % 89 (*)    Neutro Abs 9.4 (*)    Lymphocytes Relative 8 (*)    All other components within normal limits  COMPREHENSIVE METABOLIC PANEL - Abnormal; Notable for the following:    Sodium 128 (*)    Chloride 88 (*)    Glucose, Bld 853 (*)    GFR calc non Af Amer 69 (*)    GFR calc Af Amer 80 (*)    All other components within normal limits  CBG MONITORING, ED - Abnormal; Notable for the following:    Glucose-Capillary >600 (*)    All other components within normal limits  PREGNANCY, URINE  URINE MICROSCOPIC-ADD ON  HEMOGLOBIN A1C   Imaging Review No results found.   EKG Interpretation None      MDM   Final diagnoses:  Hyperglycemia  High anion gap metabolic acidosis  Zoster    33 yo female with significant hyperglycemia with anion gap of 20.  She is clinically well appearing.  Will start with iv fluids, glucomander.  No PCP.  No prior  diabetes medications.  Expect will need admission given gap.  Olivia Mackielga M Maysin Carstens, MD 07/10/13 0120

## 2013-07-10 NOTE — ED Notes (Signed)
Hold insulin gtt. Per Dr. Norlene Campbelltter.

## 2013-07-12 LAB — VIRAL CULTURE VIRC

## 2013-07-12 NOTE — Progress Notes (Signed)
Utilization Review Completed Takyla Kuchera J. Yanel Dombrosky, RN, BSN, NCM [phone]  

## 2014-01-31 ENCOUNTER — Encounter (HOSPITAL_COMMUNITY): Payer: Self-pay | Admitting: *Deleted

## 2014-08-22 ENCOUNTER — Other Ambulatory Visit: Payer: Self-pay | Admitting: Obstetrics and Gynecology

## 2019-09-06 ENCOUNTER — Other Ambulatory Visit: Payer: Self-pay | Admitting: Obstetrics and Gynecology

## 2019-09-06 DIAGNOSIS — R928 Other abnormal and inconclusive findings on diagnostic imaging of breast: Secondary | ICD-10-CM

## 2019-09-09 ENCOUNTER — Other Ambulatory Visit: Payer: Self-pay | Admitting: Obstetrics and Gynecology

## 2019-09-09 DIAGNOSIS — R928 Other abnormal and inconclusive findings on diagnostic imaging of breast: Secondary | ICD-10-CM

## 2019-09-17 ENCOUNTER — Ambulatory Visit
Admission: RE | Admit: 2019-09-17 | Discharge: 2019-09-17 | Disposition: A | Discharge status: Home / Self Care | Payer: BC Managed Care – PPO | Source: Ambulatory Visit | Attending: Obstetrics and Gynecology | Admitting: Obstetrics and Gynecology

## 2019-09-17 ENCOUNTER — Other Ambulatory Visit: Payer: Self-pay

## 2019-09-17 DIAGNOSIS — R928 Other abnormal and inconclusive findings on diagnostic imaging of breast: Secondary | ICD-10-CM

## 2020-12-07 DIAGNOSIS — Z0289 Encounter for other administrative examinations: Secondary | ICD-10-CM

## 2020-12-08 LAB — BASIC METABOLIC PANEL
BUN: 10 (ref 4–21)
CO2: 26 — AB (ref 13–22)
Chloride: 104 (ref 99–108)
Creatinine: 1 (ref 0.5–1.1)
Glucose: 74
Potassium: 4 (ref 3.4–5.3)
Sodium: 136 — AB (ref 137–147)

## 2020-12-08 LAB — HEPATIC FUNCTION PANEL
ALT: 18 (ref 7–35)
AST: 19 (ref 13–35)
Alkaline Phosphatase: 49 (ref 25–125)
Bilirubin, Total: 0.6

## 2020-12-08 LAB — LIPID PANEL
Cholesterol: 199 (ref 0–200)
HDL: 40 (ref 35–70)
LDL Cholesterol: 135
LDl/HDL Ratio: 5
Triglycerides: 137 (ref 40–160)

## 2020-12-08 LAB — COMPREHENSIVE METABOLIC PANEL
Albumin: 4.2 (ref 3.5–5.0)
Calcium: 9.3 (ref 8.7–10.7)
GFR calc Af Amer: 69

## 2020-12-08 LAB — HEMOGLOBIN A1C: Hemoglobin A1C: 6.5

## 2020-12-14 ENCOUNTER — Telehealth (INDEPENDENT_AMBULATORY_CARE_PROVIDER_SITE_OTHER): Payer: Self-pay | Admitting: Emergency Medicine

## 2020-12-14 ENCOUNTER — Encounter (INDEPENDENT_AMBULATORY_CARE_PROVIDER_SITE_OTHER): Payer: Self-pay | Admitting: Family Medicine

## 2020-12-14 ENCOUNTER — Ambulatory Visit (INDEPENDENT_AMBULATORY_CARE_PROVIDER_SITE_OTHER): Payer: BC Managed Care – PPO | Admitting: Family Medicine

## 2020-12-14 ENCOUNTER — Other Ambulatory Visit: Payer: Self-pay

## 2020-12-14 VITALS — BP 102/70 | HR 87 | Temp 98.2°F | Ht 64.0 in | Wt 190.0 lb

## 2020-12-14 DIAGNOSIS — E1165 Type 2 diabetes mellitus with hyperglycemia: Secondary | ICD-10-CM

## 2020-12-14 DIAGNOSIS — E785 Hyperlipidemia, unspecified: Secondary | ICD-10-CM

## 2020-12-14 DIAGNOSIS — Z1331 Encounter for screening for depression: Secondary | ICD-10-CM

## 2020-12-14 DIAGNOSIS — R5383 Other fatigue: Secondary | ICD-10-CM

## 2020-12-14 DIAGNOSIS — F419 Anxiety disorder, unspecified: Secondary | ICD-10-CM | POA: Diagnosis not present

## 2020-12-14 DIAGNOSIS — E66811 Obesity, class 1: Secondary | ICD-10-CM

## 2020-12-14 DIAGNOSIS — Z9189 Other specified personal risk factors, not elsewhere classified: Secondary | ICD-10-CM | POA: Diagnosis not present

## 2020-12-14 DIAGNOSIS — E1169 Type 2 diabetes mellitus with other specified complication: Secondary | ICD-10-CM

## 2020-12-14 DIAGNOSIS — R0602 Shortness of breath: Secondary | ICD-10-CM | POA: Diagnosis not present

## 2020-12-14 DIAGNOSIS — E669 Obesity, unspecified: Secondary | ICD-10-CM

## 2020-12-14 DIAGNOSIS — Z6832 Body mass index (BMI) 32.0-32.9, adult: Secondary | ICD-10-CM

## 2020-12-14 NOTE — Telephone Encounter (Signed)
Called Susan Hale for patient recent labs and EKG that was done on 12/08/20. Left a detail msg  to fax labs and EKG to our office soon as possible patient is being seen today 12/14/20

## 2020-12-14 NOTE — Progress Notes (Signed)
Dear Dr. Su Hilt,   Thank you for referring Susan Hale to our clinic. The following note includes my evaluation and treatment recommendations.  Chief Complaint:   OBESITY Susan Hale (MR# [id number]) is a 40 y.o. female who presents for evaluation and treatment of obesity and related comorbidities. Current BMI is Body mass index is 32.61 kg/m. Susan Hale has been struggling with her weight for many years and has been unsuccessful in either losing weight, maintaining weight loss, or reaching her healthy weight goal.  Susan Hale is currently in the action stage of change and ready to dedicate time achieving and maintaining a healthier weight. Susan Hale is interested in becoming our patient and working on intensive lifestyle modifications including (but not limited to) diet and exercise for weight loss.  Referred by Dr. Su Hilt. Susan Hale is a [employer] (40-60 hours most in office). She previously used [place]. Pt realizes she does not eat enough. She wakes up and goes straight to work. Around 11 AM Breakfast Bar; 1 PM- go home and likely won't eat; 5 PM- May drink a ginger ale and may get chicken sandwich with fries from [employer].  [name]'s habits were reviewed today and are as follows: Her family eats meals together, she thinks her family will eat healthier with her, her desired weight loss is 25 lbs, her heaviest weight ever was 198 pounds, she skips meals frequently, she is frequently drinking liquids with calories, she frequently makes poor food choices, she frequently eats larger portions than normal, she has binge eating behaviors, and she struggles with emotional eating.  Depression Screen Susan Hale's Food and Mood (modified PHQ-9) score was 10.  Depression screen PHQ 2/9 12/14/2020  Decreased Interest 1  Down, Depressed, Hopeless 1  PHQ - 2 Score 2  Altered sleeping 1  Tired, decreased energy 2  Change in appetite 2  Feeling bad or failure about yourself  1  Trouble  concentrating 2  Moving slowly or fidgety/restless 0  Suicidal thoughts 0  PHQ-9 Score 10  Difficult doing work/chores Somewhat difficult   Subjective:   1. Other fatigue Shakesha denies daytime somnolence and admits to waking up still tired. Susan Hale has a history of symptoms of morning fatigue. Susan Hale generally gets 5 hours of sleep per night, and states that she has poor sleep quality. Susan Hale is present. Apneic episodes are not present. Epworth Sleepiness Score is 1.  2. SOB (shortness of breath) Susan Hale notes increasing shortness of breath with exercising and seems to be worsening over time with weight gain. She notes getting out of breath sooner with activity than she used to. This has gotten worse recently. Susan Hale denies shortness of breath at rest or orthopnea.  3. Type 2 diabetes mellitus with hyperglycemia, without long-term current use of insulin (HCC) Diagnosed 2014-2015. Susan Hale's last A1c was 6.5 on 9/9. She was previously on Metformin.  4. Hyperlipidemia associated with type 2 diabetes mellitus (HCC) Her total cholesterol was 199, HDL 40, LDL 135, and triglycerides 137.  5. Anxiety Pt is on sertraline 25 mg and denies side effects. She feels symptoms are well managed.  6. At risk for deficient intake of food Susan Hale is at risk for deficient intake of food due to only eating 1 meal a day.  Assessment/Plan:   1. Other fatigue Susan Hale does feel that her weight is causing her energy to be lower than it should be. Fatigue may be related to obesity, depression or many other causes. Labs will be ordered, and in  the meanwhile, [name] will focus on self care including making healthy food choices, increasing physical activity and focusing on stress reduction. Check labs today.  - Vitamin B12 - Folate - T3 - T4, free - TSH - VITAMIN D 25 Hydroxy (Vit-D Deficiency, Fractures)  2. SOB (shortness of breath) Susan Hale does feel that she gets out of breath more easily that she used to  when she exercises. Susan Hale's shortness of breath appears to be obesity related and exercise induced. She has agreed to work on weight loss and gradually increase exercise to treat her exercise induced shortness of breath. Will continue to monitor closely. Check labs today.  - CBC with Differential/Platelet  3. Type 2 diabetes mellitus with hyperglycemia, without long-term current use of insulin (HCC) Good blood sugar control is important to decrease the likelihood of diabetic complications such as nephropathy, neuropathy, limb loss, blindness, coronary artery disease, and death. Intensive lifestyle modification including diet, exercise and weight loss are the first line of treatment for diabetes.  Check labs today.  - Insulin, random  4. Hyperlipidemia associated with type 2 diabetes mellitus (HCC) Cardiovascular risk and specific lipid/LDL goals reviewed.  We discussed several lifestyle modifications today and Susan Hale will continue to work on diet, exercise and weight loss efforts. Orders and follow up as documented in patient record. Follow up labs in 3 months.  Counseling Intensive lifestyle modifications are the first line treatment for this issue. Dietary changes: Increase soluble fiber. Decrease simple carbohydrates. Exercise changes: Moderate to vigorous-intensity aerobic activity 150 minutes per week if tolerated. Lipid-lowering medications: see documented in medical record.  5. Anxiety Behavior modification techniques were discussed today to help Susan Hale deal with her anxiety.  Orders and follow up as documented in patient record. Follow up at next appt.  6. Depression screening Susan Hale had a positive depression screening. Depression is commonly associated with obesity and often results in emotional eating behaviors. We will monitor this closely and work on CBT to help improve the non-hunger eating patterns. Referral to Psychology may be required if no improvement is seen as she  continues in our clinic.  7. At risk for deficient intake of food Susan Hale was given approximately 15 minutes of deficit intake of food prevention counseling today. Susan Hale is at risk for eating too few calories based on current food recall. She was encouraged to focus on meeting caloric and protein goals according to her recommended meal plan.   8. Obesity with current BMI of 32.7  Sofia is currently in the action stage of change and her goal is to continue with weight loss efforts. I recommend Shriya begin the structured treatment plan as follows:  She has agreed to the Category 1 Plan.  Exercise goals: No exercise has been prescribed at this time.   Behavioral modification strategies: increasing lean protein intake.  She was informed of the importance of frequent follow-up visits to maximize her success with intensive lifestyle modifications for her multiple health conditions. She was informed we would discuss her lab results at her next visit unless there is a critical issue that needs to be addressed sooner. Teneil agreed to keep her next visit at the agreed upon time to discuss these results.  Objective:   Blood pressure 102/70, pulse 87, temperature 98.2 F (36.8 C), height 5\' 4"  (1.626 m), weight 190 lb (86.2 kg), SpO2 98 %. Body mass index is 32.61 kg/m.  EKG: EKG done 12/08/20 at PCP office and pt was told it was normal.  Indirect Calorimeter completed  today shows a VO2 of 168 and a REE of 1152.  Her calculated basal metabolic rate is 0160 thus her basal metabolic rate is worse than expected.  General: Cooperative, alert, well developed, in no acute distress. HEENT: Conjunctivae and lids unremarkable. Cardiovascular: Regular rhythm.  Lungs: Normal work of breathing. Neurologic: No focal deficits.   Lab Results  Component Value Date   CREATININE 0.91 07/10/2013   BUN 11 07/10/2013   NA 141 07/10/2013   K 4.0 07/10/2013   CL 105 07/10/2013   CO2 22 07/10/2013   Lab  Results  Component Value Date   ALT 26 07/10/2013   AST 18 07/10/2013   ALKPHOS 67 07/10/2013   BILITOT 0.5 07/10/2013   Lab Results  Component Value Date   HGBA1C 10.0 (H) 07/09/2013   No results found for: INSULIN Lab Results  Component Value Date   TSH 1.402 07/25/2011   No results found for: CHOL, HDL, LDLCALC, LDLDIRECT, TRIG, CHOLHDL Lab Results  Component Value Date   WBC 9.4 07/10/2013   HGB 13.1 07/10/2013   HCT 37.7 07/10/2013   MCV 90.8 07/10/2013   PLT 200 07/10/2013    Attestation Statements:   Reviewed by clinician on day of visit: allergies, medications, problem list, medical history, surgical history, family history, social history, and previous encounter notes.  Edmund Hilda, CMA, am acting as transcriptionist for Reuben Likes, MD.   This is the patient's first visit at [facility]. The patient's NEW PATIENT PACKET was reviewed at length. Included in the packet: current and past health history, medications, allergies, ROS, gynecologic history (women only), surgical history, family history, social history, weight history, weight loss surgery history (for those that have had weight loss surgery), nutritional evaluation, mood and food questionnaire, PHQ9, Epworth questionnaire, sleep habits questionnaire, patient life and health improvement goals questionnaire. These will all be scanned into the patient's chart under media.   During the visit, I independently reviewed the patient's EKG, bioimpedance scale results, and indirect calorimeter results. I used this information to tailor a meal plan for the patient that will help her to lose weight and will improve her obesity-related conditions going forward. I performed a medically necessary appropriate examination and/or evaluation. I discussed the assessment and treatment plan with the patient. The patient was provided an opportunity to ask questions and all were answered. The patient agreed  with the plan and demonstrated an understanding of the instructions. Labs were ordered at this visit and will be reviewed at the next visit unless more critical results need to be addressed immediately. Clinical information was updated and documented in the EMR.   Time spent on visit including pre-visit chart review and post-visit care was 45 minutes.   A separate 15 minutes was spent on risk counseling (see above). I have reviewed the above documentation for accuracy and completeness, and I agree with the above. - Reuben Likes, MD

## 2020-12-15 ENCOUNTER — Other Ambulatory Visit (INDEPENDENT_AMBULATORY_CARE_PROVIDER_SITE_OTHER): Payer: Self-pay

## 2020-12-15 LAB — T3: T3, Total: 123 ng/dL (ref 71–180)

## 2020-12-15 LAB — CBC WITH DIFFERENTIAL/PLATELET
Basophils Absolute: 0.1 10*3/uL (ref 0.0–0.2)
Basos: 1 %
EOS (ABSOLUTE): 0.2 10*3/uL (ref 0.0–0.4)
Eos: 4 %
Hematocrit: 43.8 % (ref 34.0–46.6)
Hemoglobin: 14.2 g/dL (ref 11.1–15.9)
Immature Grans (Abs): 0 10*3/uL (ref 0.0–0.1)
Immature Granulocytes: 0 %
Lymphocytes Absolute: 1.4 10*3/uL (ref 0.7–3.1)
Lymphs: 27 %
MCH: 31.2 pg (ref 26.6–33.0)
MCHC: 32.4 g/dL (ref 31.5–35.7)
MCV: 96 fL (ref 79–97)
Monocytes Absolute: 0.5 10*3/uL (ref 0.1–0.9)
Monocytes: 10 %
Neutrophils Absolute: 2.9 10*3/uL (ref 1.4–7.0)
Neutrophils: 58 %
Platelets: 252 10*3/uL (ref 150–450)
RBC: 4.55 x10E6/uL (ref 3.77–5.28)
RDW: 11.6 % — ABNORMAL LOW (ref 11.7–15.4)
WBC: 5.1 10*3/uL (ref 3.4–10.8)

## 2020-12-15 LAB — VITAMIN D 25 HYDROXY (VIT D DEFICIENCY, FRACTURES): Vit D, 25-Hydroxy: 27.5 ng/mL — ABNORMAL LOW (ref 30.0–100.0)

## 2020-12-15 LAB — VITAMIN B12: Vitamin B-12: 384 pg/mL (ref 232–1245)

## 2020-12-15 LAB — TSH: TSH: 1.97 u[IU]/mL (ref 0.450–4.500)

## 2020-12-15 LAB — T4, FREE: Free T4: 1.18 ng/dL (ref 0.82–1.77)

## 2020-12-15 LAB — INSULIN, RANDOM: INSULIN: 17.8 u[IU]/mL (ref 2.6–24.9)

## 2020-12-15 LAB — FOLATE: Folate: 9.3 ng/mL (ref >3.0)

## 2020-12-28 ENCOUNTER — Encounter (INDEPENDENT_AMBULATORY_CARE_PROVIDER_SITE_OTHER): Payer: Self-pay | Admitting: Family Medicine

## 2020-12-28 ENCOUNTER — Other Ambulatory Visit: Payer: Self-pay

## 2020-12-28 ENCOUNTER — Ambulatory Visit (INDEPENDENT_AMBULATORY_CARE_PROVIDER_SITE_OTHER): Payer: BC Managed Care – PPO | Admitting: Family Medicine

## 2020-12-28 VITALS — BP 119/77 | HR 79 | Temp 98.0°F | Ht 64.0 in | Wt 190.0 lb

## 2020-12-28 DIAGNOSIS — E669 Obesity, unspecified: Secondary | ICD-10-CM

## 2020-12-28 DIAGNOSIS — Z6832 Body mass index (BMI) 32.0-32.9, adult: Secondary | ICD-10-CM

## 2020-12-28 DIAGNOSIS — E785 Hyperlipidemia, unspecified: Secondary | ICD-10-CM

## 2020-12-28 DIAGNOSIS — Z9189 Other specified personal risk factors, not elsewhere classified: Secondary | ICD-10-CM | POA: Diagnosis not present

## 2020-12-28 DIAGNOSIS — E559 Vitamin D deficiency, unspecified: Secondary | ICD-10-CM | POA: Diagnosis not present

## 2020-12-28 DIAGNOSIS — E1165 Type 2 diabetes mellitus with hyperglycemia: Secondary | ICD-10-CM | POA: Diagnosis not present

## 2020-12-28 DIAGNOSIS — E1169 Type 2 diabetes mellitus with other specified complication: Secondary | ICD-10-CM | POA: Diagnosis not present

## 2020-12-28 MED ORDER — TIRZEPATIDE 2.5 MG/0.5ML ~~LOC~~ SOAJ: 2.5000 mg | SUBCUTANEOUS | 0 refills | Status: DC

## 2020-12-28 MED ORDER — VITAMIN D (ERGOCALCIFEROL) 1.25 MG (50000 UNIT) PO CAPS: 50000.0000 [IU] | ORAL_CAPSULE | ORAL | 0 refills | Status: DC

## 2020-12-28 NOTE — Progress Notes (Signed)
Chief Complaint:   OBESITY Susan Hale is here to discuss her progress with her obesity treatment plan along with follow-up of her obesity related diagnoses. Susan Hale is on the Category 1 Plan and states she is following her eating plan approximately 97% of the time. Susan Hale states she is walking 30 minutes 7 times per week.  Today's visit was #: 2 Starting weight: 190 lbs Starting date: 12/14/2020 Today's weight: 190 lbs Today's date: 12/28/2020 Total lbs lost to date: 0 Total lbs lost since last in-office visit: 0  Interim History: Susan Hale is trying to set alarm to remind her to get food in over the day. She has no difficulty following the plan and is able to get all food in. Pt drinks protein shakes for snack calories. Breakfast is protein shake; Around 8 AM- Nutri-grain bar; 11 AM Graham crackers; Lunch- microwave meal; Dinner is microwave meal.   Subjective:   1. Type 2 diabetes mellitus with hyperglycemia, without long-term current use of insulin (HCC) Susan Hale is off Levemir and Metformin. Her last A1c was 6.5 and insulin level 17.8.  2. Vitamin D deficiency She is not on Vit D supplement. Pt reports fatigue.  3. Hyperlipidemia associated with type 2 diabetes mellitus (HCC) Pt's last LDL was 135, HDL 40, and triglycerides 237. She is not on statin therapy.  4. At risk for hyperglycemia Susan Hale is at risk for hyperglycemia due to diabetes mellitus and pt stopping all medicines.  Assessment/Plan:   1. Type 2 diabetes mellitus with hyperglycemia, without long-term current use of insulin (HCC) Susan Hale will start Mounjaro 2.5 mg. Good blood sugar control is important to decrease the likelihood of diabetic complications such as nephropathy, neuropathy, limb loss, blindness, coronary artery disease, and death. Intensive lifestyle modification including diet, exercise and weight loss are the first line of treatment for diabetes.   Start- tirzepatide [facility]) 2.5 MG/0.5ML Pen; Inject 2.5  mg into the skin once a week.  Dispense: 2 mL; Refill: 0  2. Vitamin D deficiency Low Vitamin D level contributes to fatigue and are associated with obesity, breast, and colon cancer. She agrees to start to take prescription Vitamin D 50,000 IU every week and will follow-up for routine testing of Vitamin D, at least 2-3 times per year to avoid over-replacement.  Start- Vitamin D, Ergocalciferol, (DRISDOL) 1.25 MG (50000 UNIT) CAPS capsule; Take 1 capsule (50,000 Units total) by mouth every 7 (seven) days.  Dispense: 4 capsule; Refill: 0  3. Hyperlipidemia associated with type 2 diabetes mellitus (HCC) Cardiovascular risk and specific lipid/LDL goals reviewed.  We discussed several lifestyle modifications today and Susan Hale will continue to work on diet, exercise and weight loss efforts. Orders and follow up as documented in patient record. Follow up labs in 3 months. No change in meal plan.  Counseling Intensive lifestyle modifications are the first line treatment for this issue. Dietary changes: Increase soluble fiber. Decrease simple carbohydrates. Exercise changes: Moderate to vigorous-intensity aerobic activity 150 minutes per week if tolerated. Lipid-lowering medications: see documented in medical record.  4. At risk for hyperglycemia Susan Hale was given approximately 30 minutes of counseling today regarding prevention of hyperglycemia. She was advised of hyperglycemia causes and the fact hyperglycemia is often asymptomatic. Susan Hale was instructed to avoid skipping meals, eat regular protein rich meals and schedule low calorie but protein rich snacks as needed.   Repetitive spaced learning was employed today to elicit superior memory formation and behavioral change  5. Obesity with current BMI of 32.7  Susan Hale  is currently in the action stage of change. As such, her goal is to continue with weight loss efforts. She has agreed to the Category 1 Plan.   Switch one morning snack to fruit and add  protein from substitution list to dinner.  Exercise goals: All adults should avoid inactivity. Some physical activity is better than none, and adults who participate in any amount of physical activity gain some health benefits.  Behavioral modification strategies: increasing lean protein intake, meal planning and cooking strategies, and keeping healthy foods in the home.  Susan Hale has agreed to follow-up with our clinic in 2-3 weeks. She was informed of the importance of frequent follow-up visits to maximize her success with intensive lifestyle modifications for her multiple health conditions.   Objective:   Blood pressure 119/77, pulse 79, temperature 98 F (36.7 C), height 5\' 4"  (1.626 m), weight 190 lb (86.2 kg), SpO2 100 %. Body mass index is 32.61 kg/m.  General: Cooperative, alert, well developed, in no acute distress. HEENT: Conjunctivae and lids unremarkable. Cardiovascular: Regular rhythm.  Lungs: Normal work of breathing. Neurologic: No focal deficits.   Lab Results  Component Value Date   CREATININE 1.0 12/08/2020   BUN 10 12/08/2020   NA 136 (A) 12/08/2020   K 4.0 12/08/2020   CL 104 12/08/2020   CO2 26 (A) 12/08/2020   Lab Results  Component Value Date   ALT 18 12/08/2020   AST 19 12/08/2020   ALKPHOS 49 12/08/2020   BILITOT 0.5 07/10/2013   Lab Results  Component Value Date   HGBA1C 6.5 12/08/2020   HGBA1C 10.0 (H) 07/09/2013   Lab Results  Component Value Date   INSULIN 17.8 12/14/2020   Lab Results  Component Value Date   TSH 1.970 12/14/2020   Lab Results  Component Value Date   CHOL 199 12/08/2020   HDL 40 12/08/2020   LDLCALC 135 12/08/2020   TRIG 137 12/08/2020   Lab Results  Component Value Date   VD25OH 27.5 (L) 12/14/2020   Lab Results  Component Value Date   WBC 5.1 12/14/2020   HGB 14.2 12/14/2020   HCT 43.8 12/14/2020   MCV 96 12/14/2020   PLT 252 12/14/2020    Attestation Statements:   Reviewed by clinician on day of  visit: allergies, medications, problem list, medical history, surgical history, family history, social history, and previous encounter notes.  12/16/2020, CMA, am acting as transcriptionist for Edmund Hilda, MD.   I have reviewed the above documentation for accuracy and completeness, and I agree with the above. - Reuben Likes, MD

## 2020-12-29 ENCOUNTER — Encounter (INDEPENDENT_AMBULATORY_CARE_PROVIDER_SITE_OTHER): Payer: Self-pay | Admitting: Family Medicine

## 2021-01-12 ENCOUNTER — Encounter (INDEPENDENT_AMBULATORY_CARE_PROVIDER_SITE_OTHER): Payer: Self-pay | Admitting: Family Medicine

## 2021-01-15 NOTE — Telephone Encounter (Signed)
Dr.Ukleja 

## 2021-01-15 NOTE — Telephone Encounter (Signed)
Please review. Patient was given Mounjaro on 9/29.

## 2021-01-18 ENCOUNTER — Ambulatory Visit (INDEPENDENT_AMBULATORY_CARE_PROVIDER_SITE_OTHER): Payer: BC Managed Care – PPO | Admitting: Family Medicine

## 2021-01-29 ENCOUNTER — Other Ambulatory Visit (INDEPENDENT_AMBULATORY_CARE_PROVIDER_SITE_OTHER): Payer: Self-pay | Admitting: Family Medicine

## 2021-01-29 DIAGNOSIS — E1165 Type 2 diabetes mellitus with hyperglycemia: Secondary | ICD-10-CM

## 2021-02-01 ENCOUNTER — Encounter (INDEPENDENT_AMBULATORY_CARE_PROVIDER_SITE_OTHER): Payer: Self-pay | Admitting: Family Medicine

## 2021-02-01 ENCOUNTER — Ambulatory Visit (INDEPENDENT_AMBULATORY_CARE_PROVIDER_SITE_OTHER): Payer: BC Managed Care – PPO | Admitting: Family Medicine

## 2021-02-01 ENCOUNTER — Other Ambulatory Visit: Payer: Self-pay

## 2021-02-01 VITALS — BP 108/72 | HR 78 | Temp 98.0°F | Ht 64.0 in | Wt 181.0 lb

## 2021-02-01 DIAGNOSIS — E559 Vitamin D deficiency, unspecified: Secondary | ICD-10-CM

## 2021-02-01 DIAGNOSIS — E1165 Type 2 diabetes mellitus with hyperglycemia: Secondary | ICD-10-CM

## 2021-02-01 DIAGNOSIS — Z6832 Body mass index (BMI) 32.0-32.9, adult: Secondary | ICD-10-CM | POA: Diagnosis not present

## 2021-02-01 DIAGNOSIS — E669 Obesity, unspecified: Secondary | ICD-10-CM

## 2021-02-01 MED ORDER — TIRZEPATIDE 2.5 MG/0.5ML ~~LOC~~ SOAJ: 2.5000 mg | SUBCUTANEOUS | 0 refills | Status: DC

## 2021-02-01 MED ORDER — VITAMIN D (ERGOCALCIFEROL) 1.25 MG (50000 UNIT) PO CAPS: 50000.0000 [IU] | ORAL_CAPSULE | ORAL | 0 refills | Status: DC

## 2021-02-05 NOTE — Progress Notes (Signed)
Chief Complaint:   OBESITY Susan Hale is here to discuss her progress with her obesity treatment plan along with follow-up of her obesity related diagnoses. Susan Hale is on the Category 1 Plan and states she is following her eating plan approximately 100% of the time. Susan Hale states she is walking on the treadmill 30-40 minutes 4 times per week.  Today's visit was #: 3 Starting weight: 190 lbs Starting date: 12/14/2020 Today's weight: 181 lbs Today's date: 02/01/2021 Total lbs lost to date: 9 Total lbs lost since last in-office visit: 9  Interim History: Susan Hale is still on category 1 when she can get more food in. Morning is a protein bar, around 10 AM is [place] sausage and cheese; Lunch is a lean cuisine and dinner is a Development worker, community and fruit. She denies hunger or cravings. Pt has no upcoming obstacles. She is not sure what her plans are for Thanksgiving.  Subjective:   1. Type 2 diabetes mellitus with hyperglycemia, without long-term current use of insulin (HCC) Susan Hale is on Mounjaro 2.5. She is doing well on Mounjaro dose and denies cravings.  2. Vitamin D deficiency Pt denies nausea, vomiting, and muscle weakness but notes fatigue. She is on prescription Vit D.  Assessment/Plan:   1. Type 2 diabetes mellitus with hyperglycemia, without long-term current use of insulin (HCC) Good blood sugar control is important to decrease the likelihood of diabetic complications such as nephropathy, neuropathy, limb loss, blindness, coronary artery disease, and death. Intensive lifestyle modification including diet, exercise and weight loss are the first line of treatment for diabetes.   Refill- tirzepatide (MOUNJARO) 2.5 MG/0.5ML Pen; Inject 2.5 mg into the skin once a week.  Dispense: 2 mL; Refill: 0  2. Vitamin D deficiency Low Vitamin D level contributes to fatigue and are associated with obesity, breast, and colon cancer. She agrees to continue to take prescription Vitamin D 50,000 IU every  week and will follow-up for routine testing of Vitamin D, at least 2-3 times per year to avoid over-replacement.  Refill- Vitamin D, Ergocalciferol, (DRISDOL) 1.25 MG (50000 UNIT) CAPS capsule; Take 1 capsule (50,000 Units total) by mouth every 7 (seven) days.  Dispense: 4 capsule; Refill: 0  3. Obesity with current BMI of 31.1  Susan Hale is currently in the action stage of change. As such, her goal is to continue with weight loss efforts. She has agreed to the Category 1 Plan.   Exercise goals: All adults should avoid inactivity. Some physical activity is better than none, and adults who participate in any amount of physical activity gain some health benefits.  Behavioral modification strategies: increasing lean protein intake, meal planning and cooking strategies, and keeping healthy foods in the home.  Susan Hale has agreed to follow-up with our clinic in 2-3 weeks. She was informed of the importance of frequent follow-up visits to maximize her success with intensive lifestyle modifications for her multiple health conditions.   Objective:   Blood pressure 108/72, pulse 78, temperature 98 F (36.7 C), height 5\' 4"  (1.626 m), weight 181 lb (82.1 kg), SpO2 99 %. Body mass index is 31.07 kg/m.  General: Cooperative, alert, well developed, in no acute distress. HEENT: Conjunctivae and lids unremarkable. Cardiovascular: Regular rhythm.  Lungs: Normal work of breathing. Neurologic: No focal deficits.   Lab Results  Component Value Date   CREATININE 1.0 12/08/2020   BUN 10 12/08/2020   NA 136 (A) 12/08/2020   K 4.0 12/08/2020   CL 104 12/08/2020   CO2 26 (  A) 12/08/2020   Lab Results  Component Value Date   ALT 18 12/08/2020   AST 19 12/08/2020   ALKPHOS 49 12/08/2020   BILITOT 0.5 07/10/2013   Lab Results  Component Value Date   HGBA1C 6.5 12/08/2020   HGBA1C 10.0 (H) 07/09/2013   Lab Results  Component Value Date   INSULIN 17.8 12/14/2020   Lab Results  Component Value Date    TSH 1.970 12/14/2020   Lab Results  Component Value Date   CHOL 199 12/08/2020   HDL 40 12/08/2020   LDLCALC 135 12/08/2020   TRIG 137 12/08/2020   Lab Results  Component Value Date   VD25OH 27.5 (L) 12/14/2020   Lab Results  Component Value Date   WBC 5.1 12/14/2020   HGB 14.2 12/14/2020   HCT 43.8 12/14/2020   MCV 96 12/14/2020   PLT 252 12/14/2020    Attestation Statements:   Reviewed by clinician on day of visit: allergies, medications, problem list, medical history, surgical history, family history, social history, and previous encounter notes.  Edmund Hilda, CMA, am acting as transcriptionist for Reuben Likes, MD.   I have reviewed the above documentation for accuracy and completeness, and I agree with the above. - Reuben Likes, MD

## 2021-02-26 ENCOUNTER — Encounter (INDEPENDENT_AMBULATORY_CARE_PROVIDER_SITE_OTHER): Payer: Self-pay | Admitting: Family Medicine

## 2021-02-26 ENCOUNTER — Ambulatory Visit (INDEPENDENT_AMBULATORY_CARE_PROVIDER_SITE_OTHER): Payer: BC Managed Care – PPO | Admitting: Family Medicine

## 2021-02-26 ENCOUNTER — Other Ambulatory Visit: Payer: Self-pay

## 2021-02-26 VITALS — BP 109/70 | HR 74 | Temp 98.2°F | Ht 64.0 in | Wt 179.0 lb

## 2021-02-26 DIAGNOSIS — Z6832 Body mass index (BMI) 32.0-32.9, adult: Secondary | ICD-10-CM

## 2021-02-26 DIAGNOSIS — E669 Obesity, unspecified: Secondary | ICD-10-CM

## 2021-02-26 DIAGNOSIS — E1165 Type 2 diabetes mellitus with hyperglycemia: Secondary | ICD-10-CM | POA: Diagnosis not present

## 2021-02-26 DIAGNOSIS — E559 Vitamin D deficiency, unspecified: Secondary | ICD-10-CM | POA: Diagnosis not present

## 2021-02-26 MED ORDER — VITAMIN D (ERGOCALCIFEROL) 1.25 MG (50000 UNIT) PO CAPS: 50000.0000 [IU] | ORAL_CAPSULE | ORAL | 0 refills | Status: DC

## 2021-02-26 MED ORDER — TIRZEPATIDE 2.5 MG/0.5ML ~~LOC~~ SOAJ: 2.5000 mg | SUBCUTANEOUS | 0 refills | Status: DC

## 2021-02-27 NOTE — Progress Notes (Signed)
Chief Complaint:   OBESITY Susan Hale is here to discuss her progress with her obesity treatment plan along with follow-up of her obesity related diagnoses. Susan Hale is on the Category 1 Plan and states she is following her eating plan approximately 98% of the time. Susan Hale states she is walking on the treadmill 30-45 minutes 4 times per week.  Today's visit was #: 4 Starting weight: 190 lbs Starting date: 12/14/2020 Today's weight: 179 lbs Today's date: 02/26/2021 Total lbs lost to date: 11 Total lbs lost since last in-office visit: 2  Interim History: Susan Hale ate some sweet potato pie over the last few weeks. She is still able to get all food in most of the time. On certain days, she can't eat everything. Pt has no plans for her upcoming birthday. She doesn't think any other meal plan will be easier (not ready for category 2 yet).  Subjective:   1. Vitamin D deficiency Pt denies nausea, vomiting, and muscle weakness but notes fatigue. She is on prescription Vit D.  2. Type 2 diabetes mellitus with hyperglycemia, without long-term current use of insulin (HCC) Susan Hale's last A1c was 6.5 and insulin level 17.8. She is doing well on Mounjaro with no GI side effects.  Assessment/Plan:   1. Vitamin D deficiency Low Vitamin D level contributes to fatigue and are associated with obesity, breast, and colon cancer. She agrees to continue to take prescription Vitamin D 50,000 IU every week and will follow-up for routine testing of Vitamin D, at least 2-3 times per year to avoid over-replacement.  Refill- Vitamin D, Ergocalciferol, (DRISDOL) 1.25 MG (50000 UNIT) CAPS capsule; Take 1 capsule (50,000 Units total) by mouth every 7 (seven) days.  Dispense: 4 capsule; Refill: 0  2. Type 2 diabetes mellitus with hyperglycemia, without long-term current use of insulin (HCC) Good blood sugar control is important to decrease the likelihood of diabetic complications such as nephropathy, neuropathy, limb  loss, blindness, coronary artery disease, and death. Intensive lifestyle modification including diet, exercise and weight loss are the first line of treatment for diabetes.   Refill- tirzepatide (MOUNJARO) 2.5 MG/0.5ML Pen; Inject 2.5 mg into the skin once a week.  Dispense: 2 mL; Refill: 0  3. Obesity with current BMI of 30.8  Susan Hale is currently in the action stage of change. As such, her goal is to continue with weight loss efforts. She has agreed to the Category 1 Plan.   Exercise goals: All adults should avoid inactivity. Some physical activity is better than none, and adults who participate in any amount of physical activity gain some health benefits.  Behavioral modification strategies: increasing lean protein intake, meal planning and cooking strategies, and keeping healthy foods in the home.  Susan Hale has agreed to follow-up with our clinic in 3 weeks. She was informed of the importance of frequent follow-up visits to maximize her success with intensive lifestyle modifications for her multiple health conditions.   Objective:   Blood pressure 109/70, pulse 74, temperature 98.2 F (36.8 C), height 5\' 4"  (1.626 m), weight 179 lb (81.2 kg), SpO2 99 %. Body mass index is 30.73 kg/m.  General: Cooperative, alert, well developed, in no acute distress. HEENT: Conjunctivae and lids unremarkable. Cardiovascular: Regular rhythm.  Lungs: Normal work of breathing. Neurologic: No focal deficits.   Lab Results  Component Value Date   CREATININE 1.0 12/08/2020   BUN 10 12/08/2020   NA 136 (A) 12/08/2020   K 4.0 12/08/2020   CL 104 12/08/2020   CO2  26 (A) 12/08/2020   Lab Results  Component Value Date   ALT 18 12/08/2020   AST 19 12/08/2020   ALKPHOS 49 12/08/2020   BILITOT 0.5 07/10/2013   Lab Results  Component Value Date   HGBA1C 6.5 12/08/2020   HGBA1C 10.0 (H) 07/09/2013   Lab Results  Component Value Date   INSULIN 17.8 12/14/2020   Lab Results  Component Value Date    TSH 1.970 12/14/2020   Lab Results  Component Value Date   CHOL 199 12/08/2020   HDL 40 12/08/2020   LDLCALC 135 12/08/2020   TRIG 137 12/08/2020   Lab Results  Component Value Date   VD25OH 27.5 (L) 12/14/2020   Lab Results  Component Value Date   WBC 5.1 12/14/2020   HGB 14.2 12/14/2020   HCT 43.8 12/14/2020   MCV 96 12/14/2020   PLT 252 12/14/2020    Attestation Statements:   Reviewed by clinician on day of visit: allergies, medications, problem list, medical history, surgical history, family history, social history, and previous encounter notes.  Edmund Hilda, CMA, am acting as transcriptionist for Reuben Likes, MD.   I have reviewed the above documentation for accuracy and completeness, and I agree with the above. - Reuben Likes, MD

## 2021-03-14 ENCOUNTER — Other Ambulatory Visit: Payer: Self-pay

## 2021-03-14 ENCOUNTER — Encounter (INDEPENDENT_AMBULATORY_CARE_PROVIDER_SITE_OTHER): Payer: Self-pay | Admitting: Family Medicine

## 2021-03-14 ENCOUNTER — Ambulatory Visit (INDEPENDENT_AMBULATORY_CARE_PROVIDER_SITE_OTHER): Payer: BC Managed Care – PPO | Admitting: Family Medicine

## 2021-03-14 VITALS — BP 102/69 | HR 77 | Temp 98.0°F | Ht 64.0 in | Wt 180.0 lb

## 2021-03-14 DIAGNOSIS — Z6832 Body mass index (BMI) 32.0-32.9, adult: Secondary | ICD-10-CM | POA: Diagnosis not present

## 2021-03-14 DIAGNOSIS — E669 Obesity, unspecified: Secondary | ICD-10-CM | POA: Diagnosis not present

## 2021-03-14 DIAGNOSIS — E559 Vitamin D deficiency, unspecified: Secondary | ICD-10-CM | POA: Diagnosis not present

## 2021-03-14 DIAGNOSIS — E1165 Type 2 diabetes mellitus with hyperglycemia: Secondary | ICD-10-CM | POA: Diagnosis not present

## 2021-03-14 MED ORDER — VITAMIN D (ERGOCALCIFEROL) 1.25 MG (50000 UNIT) PO CAPS: 50000.0000 [IU] | ORAL_CAPSULE | ORAL | 0 refills | Status: DC

## 2021-03-14 MED ORDER — TIRZEPATIDE 2.5 MG/0.5ML ~~LOC~~ SOAJ: 2.5000 mg | SUBCUTANEOUS | 0 refills | Status: DC

## 2021-03-15 NOTE — Progress Notes (Signed)
Chief Complaint:   OBESITY Susan Hale is here to discuss her progress with her obesity treatment plan along with follow-up of her obesity related diagnoses. Susan Hale is on the Category 1 Plan and states she is following her eating plan approximately 100% of the time. Susan Hale states she is walking 30-40 minutes 3-4 times per week.  Today's visit was #: 5 Starting weight: 190 lbs Starting date: 12/14/2020 Today's weight: 180 lbs Today's date: 03/14/2021 Total lbs lost to date: 10 Total lbs lost since last in-office visit: 0  Interim History: Susan Hale had a birthday yesterday and went to a [place]. She has not done much Christmas shopping yet. Pt is still following the meal plan and notes minimal hunger. If she gets hungry, she will eat green grapes. She is doing cheese, [place] sausage, graham crackers, or green grapes for snack calories.  Subjective:   1. Vitamin D deficiency Pt's last Vit D level was 27.5 and she is on Rx Vit D.  2. Type 2 diabetes mellitus with hyperglycemia, without long-term current use of insulin (HCC) Susan Hale's last A1c was 6.5. She is on Mounjaro 2.5 mg.  Assessment/Plan:   1. Vitamin D deficiency Low Vitamin D level contributes to fatigue and are associated with obesity, breast, and colon cancer. She agrees to continue to take prescription Vitamin D 50,000 IU every week and will follow-up for routine testing of Vitamin D, at least 2-3 times per year to avoid over-replacement.  Refill- Vitamin D, Ergocalciferol, (DRISDOL) 1.25 MG (50000 UNIT) CAPS capsule; Take 1 capsule (50,000 Units total) by mouth every 7 (seven) days.  Dispense: 4 capsule; Refill: 0  2. Type 2 diabetes mellitus with hyperglycemia, without long-term current use of insulin (HCC) Good blood sugar control is important to decrease the likelihood of diabetic complications such as nephropathy, neuropathy, limb loss, blindness, coronary artery disease, and death. Intensive lifestyle  modification including diet, exercise and weight loss are the first line of treatment for diabetes.   Refill- tirzepatide (MOUNJARO) 2.5 MG/0.5ML Pen; Inject 2.5 mg into the skin once a week.  Dispense: 2 mL; Refill: 0  3. Obesity with current BMI of 31.8  Susan Hale is currently in the action stage of change. As such, her goal is to continue with weight loss efforts. She has agreed to the Category 1 Plan.   Exercise goals: All adults should avoid inactivity. Some physical activity is better than none, and adults who participate in any amount of physical activity gain some health benefits.  Behavioral modification strategies: increasing lean protein intake, meal planning and cooking strategies, keeping healthy foods in the home, and planning for success.  Susan Hale has agreed to follow-up with our clinic in 3 weeks. She was informed of the importance of frequent follow-up visits to maximize her success with intensive lifestyle modifications for her multiple health conditions.   Objective:   Blood pressure 102/69, pulse 77, temperature 98 F (36.7 C), height 5\' 4"  (1.626 m), weight 180 lb (81.6 kg), SpO2 100 %. Body mass index is 30.9 kg/m.  General: Cooperative, alert, well developed, in no acute distress. HEENT: Conjunctivae and lids unremarkable. Cardiovascular: Regular rhythm.  Lungs: Normal work of breathing. Neurologic: No focal deficits.   Lab Results  Component Value Date   CREATININE 1.0 12/08/2020   BUN 10 12/08/2020   NA 136 (A) 12/08/2020   K 4.0 12/08/2020   CL 104 12/08/2020   CO2 26 (A) 12/08/2020   Lab Results  Component Value Date  ALT 18 12/08/2020   AST 19 12/08/2020   ALKPHOS 49 12/08/2020   BILITOT 0.5 07/10/2013   Lab Results  Component Value Date   HGBA1C 6.5 12/08/2020   HGBA1C 10.0 (H) 07/09/2013   Lab Results  Component Value Date   INSULIN 17.8 12/14/2020   Lab Results  Component Value Date   TSH 1.970 12/14/2020   Lab Results  Component  Value Date   CHOL 199 12/08/2020   HDL 40 12/08/2020   LDLCALC 135 12/08/2020   TRIG 137 12/08/2020   Lab Results  Component Value Date   VD25OH 27.5 (L) 12/14/2020   Lab Results  Component Value Date   WBC 5.1 12/14/2020   HGB 14.2 12/14/2020   HCT 43.8 12/14/2020   MCV 96 12/14/2020   PLT 252 12/14/2020    Attestation Statements:   Reviewed by clinician on day of visit: allergies, medications, problem list, medical history, surgical history, family history, social history, and previous encounter notes.  Edmund Hilda, CMA, am acting as transcriptionist for Reuben Likes, MD.  I have reviewed the above documentation for accuracy and completeness, and I agree with the above. - Reuben Likes, MD

## 2021-04-05 ENCOUNTER — Ambulatory Visit (INDEPENDENT_AMBULATORY_CARE_PROVIDER_SITE_OTHER): Payer: BC Managed Care – PPO | Admitting: Family Medicine

## 2021-04-05 ENCOUNTER — Other Ambulatory Visit: Payer: Self-pay

## 2021-04-05 ENCOUNTER — Encounter (INDEPENDENT_AMBULATORY_CARE_PROVIDER_SITE_OTHER): Payer: Self-pay | Admitting: Family Medicine

## 2021-04-05 VITALS — BP 106/69 | HR 81 | Temp 98.0°F | Ht 64.0 in | Wt 181.0 lb

## 2021-04-05 DIAGNOSIS — Z6831 Body mass index (BMI) 31.0-31.9, adult: Secondary | ICD-10-CM

## 2021-04-05 DIAGNOSIS — E1165 Type 2 diabetes mellitus with hyperglycemia: Secondary | ICD-10-CM | POA: Diagnosis not present

## 2021-04-05 DIAGNOSIS — E559 Vitamin D deficiency, unspecified: Secondary | ICD-10-CM

## 2021-04-05 DIAGNOSIS — E669 Obesity, unspecified: Secondary | ICD-10-CM

## 2021-04-05 MED ORDER — TIRZEPATIDE 2.5 MG/0.5ML ~~LOC~~ SOAJ: 2.5000 mg | SUBCUTANEOUS | 0 refills | Status: DC

## 2021-04-09 NOTE — Progress Notes (Signed)
Chief Complaint:   OBESITY Susan Hale is here to discuss her progress with her obesity treatment plan along with follow-up of her obesity related diagnoses. Susan Hale is on the Category 1 Plan and states she is following her eating plan approximately 80% of the time. Susan Hale states she is walking 30-40 minutes 3-4 times per week.  Today's visit was #: 6 Starting weight: 190 lbs Starting date: 12/14/2020 Today's weight: 181 lbs Today's date: 04/05/2021 Total lbs lost to date: 9 Total lbs lost since last in-office visit: 0  Interim History: Pt stayed home for the holidays. She reports she spent money over the last few weeks. She bought herself a new couch. Pt is doing ok on Mounjaro but doesn't care for click. She is doing well on category 1- did eat a piece of cake over the holiday.  Subjective:   1. Type 2 diabetes mellitus with hyperglycemia, without long-term current use of insulin (HCC) Susan Hale reports occasional nausea on 2.5 Mounjaro. She denies hunger.  2. Vitamin D deficiency Pt denies nausea, vomiting, and muscle weakness but notes fatigue. She is on prescription Vit D.  Assessment/Plan:   1. Type 2 diabetes mellitus with hyperglycemia, without long-term current use of insulin (HCC) Good blood sugar control is important to decrease the likelihood of diabetic complications such as nephropathy, neuropathy, limb loss, blindness, coronary artery disease, and death. Intensive lifestyle modification including diet, exercise and weight loss are the first line of treatment for diabetes.   Refill- tirzepatide (MOUNJARO) 2.5 MG/0.5ML Pen; Inject 2.5 mg into the skin once a week.  Dispense: 2 mL; Refill: 0  2. Vitamin D deficiency Low Vitamin D level contributes to fatigue and are associated with obesity, breast, and colon cancer. She agrees to continue to take prescription Vitamin D 50,000 IU every week and will follow-up for routine testing of Vitamin D, at least 2-3 times per year to avoid  over-replacement. No refill needed.  3. Obesity with current BMI of 31.1  Susan Hale is currently in the action stage of change. As such, her goal is to continue with weight loss efforts. She has agreed to the Category 1 Plan.   Exercise goals:  Pt is to add in 6 minutes of resistance training 3 times a week.  Behavioral modification strategies: increasing lean protein intake, meal planning and cooking strategies, keeping healthy foods in the home, and planning for success.  Susan Hale has agreed to follow-up with our clinic in 3 weeks. She was informed of the importance of frequent follow-up visits to maximize her success with intensive lifestyle modifications for her multiple health conditions.   Objective:   Blood pressure 106/69, pulse 81, temperature 98 F (36.7 C), height 5\' 4"  (1.626 m), weight 181 lb (82.1 kg), SpO2 97 %. Body mass index is 31.07 kg/m.  General: Cooperative, alert, well developed, in no acute distress. HEENT: Conjunctivae and lids unremarkable. Cardiovascular: Regular rhythm.  Lungs: Normal work of breathing. Neurologic: No focal deficits.   Lab Results  Component Value Date   CREATININE 1.0 12/08/2020   BUN 10 12/08/2020   NA 136 (A) 12/08/2020   K 4.0 12/08/2020   CL 104 12/08/2020   CO2 26 (A) 12/08/2020   Lab Results  Component Value Date   ALT 18 12/08/2020   AST 19 12/08/2020   ALKPHOS 49 12/08/2020   BILITOT 0.5 07/10/2013   Lab Results  Component Value Date   HGBA1C 6.5 12/08/2020   HGBA1C 10.0 (H) 07/09/2013   Lab Results  Component Value Date   INSULIN 17.8 12/14/2020   Lab Results  Component Value Date   TSH 1.970 12/14/2020   Lab Results  Component Value Date   CHOL 199 12/08/2020   HDL 40 12/08/2020   LDLCALC 135 12/08/2020   TRIG 137 12/08/2020   Lab Results  Component Value Date   VD25OH 27.5 (L) 12/14/2020   Lab Results  Component Value Date   WBC 5.1 12/14/2020   HGB 14.2 12/14/2020   HCT 43.8 12/14/2020   MCV 96  12/14/2020   PLT 252 12/14/2020    Attestation Statements:   Reviewed by clinician on day of visit: allergies, medications, problem list, medical history, surgical history, family history, social history, and previous encounter notes.  Edmund Hilda, CMA, am acting as transcriptionist for Reuben Likes, MD.   I have reviewed the above documentation for accuracy and completeness, and I agree with the above. - Reuben Likes, MD

## 2021-04-30 ENCOUNTER — Ambulatory Visit (INDEPENDENT_AMBULATORY_CARE_PROVIDER_SITE_OTHER): Payer: BC Managed Care – PPO | Admitting: Family Medicine

## 2021-04-30 ENCOUNTER — Other Ambulatory Visit: Payer: Self-pay

## 2021-04-30 ENCOUNTER — Encounter (INDEPENDENT_AMBULATORY_CARE_PROVIDER_SITE_OTHER): Payer: Self-pay | Admitting: Family Medicine

## 2021-04-30 VITALS — BP 112/68 | HR 85 | Temp 98.3°F | Ht 64.0 in | Wt 181.0 lb

## 2021-04-30 DIAGNOSIS — E1165 Type 2 diabetes mellitus with hyperglycemia: Secondary | ICD-10-CM

## 2021-04-30 DIAGNOSIS — Z6831 Body mass index (BMI) 31.0-31.9, adult: Secondary | ICD-10-CM | POA: Diagnosis not present

## 2021-04-30 DIAGNOSIS — E559 Vitamin D deficiency, unspecified: Secondary | ICD-10-CM | POA: Diagnosis not present

## 2021-04-30 DIAGNOSIS — E669 Obesity, unspecified: Secondary | ICD-10-CM

## 2021-04-30 DIAGNOSIS — Z7985 Long-term (current) use of injectable non-insulin antidiabetic drugs: Secondary | ICD-10-CM

## 2021-04-30 MED ORDER — VITAMIN D (ERGOCALCIFEROL) 1.25 MG (50000 UNIT) PO CAPS: 50000.0000 [IU] | ORAL_CAPSULE | ORAL | 0 refills | Status: DC

## 2021-04-30 MED ORDER — TIRZEPATIDE 2.5 MG/0.5ML ~~LOC~~ SOAJ: 2.5000 mg | SUBCUTANEOUS | 0 refills | Status: DC

## 2021-05-01 NOTE — Progress Notes (Signed)
Chief Complaint:   OBESITY Susan Hale is here to discuss her progress with her obesity treatment plan along with follow-up of her obesity related diagnoses. Susan Hale is on the Category 1 Plan and states she is following her eating plan approximately 97% of the time. Susan Hale states she is walking and weight training 30-45 minutes 3-4 times per week.  Today's visit was #: 7 Starting weight: 190 lbs Starting date: 12/14/2020 Today's weight: 181 lbs Today's date: 04/30/2021 Total lbs lost to date: 9 Total lbs lost since last in-office visit: 0  Interim History: The last few weeks, pt has been following meal plan and wondering why the scale isn't changing. She is noticing if she waits too long to eat, she will break out in a sweat. Some days, she may gravitate toward [place] sausages. Lunch is part of a microwave meal. Pt has no plans for the next few weeks, except [place]. She is wondering about drinking a protein shake.  Subjective:   1. Vitamin D deficiency Pt is on Rx Vit D and her last level was 27.5.  2. Type 2 diabetes mellitus with hyperglycemia, without long-term current use of insulin (HCC) Pt is on Mounjaro with some eating control and no side effects.  Assessment/Plan:   1. Vitamin D deficiency Low Vitamin D level contributes to fatigue and are associated with obesity, breast, and colon cancer. She agrees to continue to take prescription Vitamin D 50,000 IU every week and will follow-up for routine testing of Vitamin D, at least 2-3 times per year to avoid over-replacement.  Refill- Vitamin D, Ergocalciferol, (DRISDOL) 1.25 MG (50000 UNIT) CAPS capsule; Take 1 capsule (50,000 Units total) by mouth every 7 (seven) days.  Dispense: 4 capsule; Refill: 0  2. Type 2 diabetes mellitus with hyperglycemia, without long-term current use of insulin (HCC) Good blood sugar control is important to decrease the likelihood of diabetic complications such as nephropathy,  neuropathy, limb loss, blindness, coronary artery disease, and death. Intensive lifestyle modification including diet, exercise and weight loss are the first line of treatment for diabetes.   Refill- tirzepatide (MOUNJARO) 2.5 MG/0.5ML Pen; Inject 2.5 mg into the skin once a week.  Dispense: 2 mL; Refill: 0  3. Obesity with current BMI of 31.1 Susan Hale is currently in the action stage of change. As such, her goal is to continue with weight loss efforts. She has agreed to the Category 1 Plan.   Exercise goals:  As is  Behavioral modification strategies: increasing lean protein intake, meal planning and cooking strategies, keeping healthy foods in the home, and planning for success.  Susan Hale has agreed to follow-up with our clinic in 3-4 weeks. She was informed of the importance of frequent follow-up visits to maximize her success with intensive lifestyle modifications for her multiple health conditions.   Objective:   Blood pressure 112/68, pulse 85, temperature 98.3 F (36.8 C), height 5\' 4"  (1.626 m), weight 181 lb (82.1 kg), SpO2 99 %. Body mass index is 31.07 kg/m.  General: Cooperative, alert, well developed, in no acute distress. HEENT: Conjunctivae and lids unremarkable. Cardiovascular: Regular rhythm.  Lungs: Normal work of breathing. Neurologic: No focal deficits.   Lab Results  Component Value Date   CREATININE 1.0 12/08/2020   BUN 10 12/08/2020   NA 136 (A) 12/08/2020   K 4.0 12/08/2020   CL 104 12/08/2020   CO2 26 (A) 12/08/2020   Lab Results  Component Value Date   ALT 18  12/08/2020   AST 19 12/08/2020   ALKPHOS 49 12/08/2020   BILITOT 0.5 07/10/2013   Lab Results  Component Value Date   HGBA1C 6.5 12/08/2020   HGBA1C 10.0 (H) 07/09/2013   Lab Results  Component Value Date   INSULIN 17.8 12/14/2020   Lab Results  Component Value Date   TSH 1.970 12/14/2020   Lab Results  Component Value Date   CHOL 199 12/08/2020   HDL 40 12/08/2020   LDLCALC 135  12/08/2020   TRIG 137 12/08/2020   Lab Results  Component Value Date   VD25OH 27.5 (L) 12/14/2020   Lab Results  Component Value Date   WBC 5.1 12/14/2020   HGB 14.2 12/14/2020   HCT 43.8 12/14/2020   MCV 96 12/14/2020   PLT 252 12/14/2020    Attestation Statements:   Reviewed by clinician on day of visit: allergies, medications, problem list, medical history, surgical history, family history, social history, and previous encounter notes.  Edmund Hilda, CMA, am acting as transcriptionist for Reuben Likes, MD.   I have reviewed the above documentation for accuracy and completeness, and I agree with the above. - Reuben Likes, MD

## 2021-05-21 ENCOUNTER — Encounter (INDEPENDENT_AMBULATORY_CARE_PROVIDER_SITE_OTHER): Payer: Self-pay | Admitting: Family Medicine

## 2021-05-21 ENCOUNTER — Ambulatory Visit (INDEPENDENT_AMBULATORY_CARE_PROVIDER_SITE_OTHER): Payer: BC Managed Care – PPO | Admitting: Family Medicine

## 2021-05-21 ENCOUNTER — Other Ambulatory Visit: Payer: Self-pay

## 2021-05-21 VITALS — BP 110/73 | HR 83 | Temp 98.1°F | Ht 64.0 in | Wt 182.0 lb

## 2021-05-21 DIAGNOSIS — E669 Obesity, unspecified: Secondary | ICD-10-CM | POA: Diagnosis not present

## 2021-05-21 DIAGNOSIS — E559 Vitamin D deficiency, unspecified: Secondary | ICD-10-CM

## 2021-05-21 DIAGNOSIS — Z6831 Body mass index (BMI) 31.0-31.9, adult: Secondary | ICD-10-CM

## 2021-05-21 DIAGNOSIS — E1165 Type 2 diabetes mellitus with hyperglycemia: Secondary | ICD-10-CM | POA: Diagnosis not present

## 2021-05-21 DIAGNOSIS — Z9189 Other specified personal risk factors, not elsewhere classified: Secondary | ICD-10-CM

## 2021-05-21 MED ORDER — TIRZEPATIDE 2.5 MG/0.5ML ~~LOC~~ SOAJ: 2.5000 mg | SUBCUTANEOUS | 0 refills | Status: DC

## 2021-05-21 MED ORDER — VITAMIN D (ERGOCALCIFEROL) 1.25 MG (50000 UNIT) PO CAPS: 50000.0000 [IU] | ORAL_CAPSULE | ORAL | 0 refills | Status: DC

## 2021-05-22 ENCOUNTER — Encounter (INDEPENDENT_AMBULATORY_CARE_PROVIDER_SITE_OTHER): Payer: Self-pay

## 2021-05-22 NOTE — Progress Notes (Signed)
Chief Complaint:   OBESITY Susan Hale is here to discuss her progress with her obesity treatment plan along with follow-up of her obesity related diagnoses. Susan Hale is on the Category 1 Plan and states she is following her eating plan approximately 99% of the time. Susan Hale states she is walking and weight training 35 minutes 3-4 times per week.  Today's visit was #: 8 Starting weight: 190 lbs Starting date: 12/14/2020 Today's weight: 182 lbs Today's date: 05/21/2021 Total lbs lost to date: 8 Total lbs lost since last in-office visit: 0  Interim History: Susan Hale is eating on plan and denies hunger. She is doing grapes and apple slices for snacks. Her daughters birthday is coming up. [name] She is trying to incorporate more consistent physical activity. She is doing a small can of ginger ale daily.  Subjective:   1. Vitamin D deficiency Susan Hale denies nausea, vomiting, and muscle weakness but notes fatigue. She is on prescription Vit D.  2. Type 2 diabetes mellitus with hyperglycemia, without long-term current use of insulin (HCC) For unknown reasons, pharmacy did not fill Mounjaro, so pt has not started it.  3. At risk for side effect of medication Susan Hale is at risk for side effects of medication due to starting [facility].  Assessment/Plan:   1. Vitamin D deficiency Low Vitamin D level contributes to fatigue and are associated with obesity, breast, and colon cancer. She agrees to continue to take prescription Vitamin D 50,000 IU every week and will follow-up for routine testing of Vitamin D, at least 2-3 times per year to avoid over-replacement.  Refill- Vitamin D, Ergocalciferol, (DRISDOL) 1.25 MG (50000 UNIT) CAPS capsule; Take 1 capsule (50,000 Units total) by mouth every 7 (seven) days.  Dispense: 4 capsule; Refill: 0  2. Type 2 diabetes mellitus with hyperglycemia, without long-term current use of insulin (HCC) Good blood sugar control  is important to decrease the likelihood of diabetic complications such as nephropathy, neuropathy, limb loss, blindness, coronary artery disease, and death. Intensive lifestyle modification including diet, exercise and weight loss are the first line of treatment for diabetes.   Resent to pharmacy- tirzepatide [facility]) 2.5 MG/0.5ML Pen; Inject 2.5 mg into the skin once a week.  Dispense: 2 mL; Refill: 0  3. At risk for side effect of medication Susan Hale was given approximately 15 minutes of drug side effect counseling today.  We discussed side effect possibility and risk versus benefits. Susan Hale agreed to the medication and will contact this office if these side effects are intolerable.  Repetitive spaced learning was employed today to elicit superior memory formation and behavioral change.   4. Obesity with current BMI of 31.3 Susan Hale is currently in the action stage of change. As such, her goal is to continue with weight loss efforts. She has agreed to the Category 2 Plan.   Exercise goals:  As is  Behavioral modification strategies: increasing lean protein intake, meal planning and cooking strategies, keeping healthy foods in the home, and planning for success.  Susan Hale has agreed to follow-up with our clinic in 3 weeks. She was informed of the importance of frequent follow-up visits to maximize her success with intensive lifestyle modifications for her multiple health conditions.   Objective:   Blood pressure 110/73, pulse 83, temperature 98.1 F (36.7 C), height 5\' 4"  (1.626 m), weight 182 lb (82.6 kg), SpO2 100 %. Body mass index is 31.24 kg/m.  General: Cooperative, alert, well developed, in no acute  distress. HEENT: Conjunctivae and lids unremarkable. Cardiovascular: Regular rhythm.  Lungs: Normal work of breathing. Neurologic: No focal deficits.   Lab Results  Component Value Date   CREATININE 1.0 12/08/2020   BUN 10 12/08/2020   NA 136 (A) 12/08/2020   K 4.0 12/08/2020   CL  104 12/08/2020   CO2 26 (A) 12/08/2020   Lab Results  Component Value Date   ALT 18 12/08/2020   AST 19 12/08/2020   ALKPHOS 49 12/08/2020   BILITOT 0.5 07/10/2013   Lab Results  Component Value Date   HGBA1C 6.5 12/08/2020   HGBA1C 10.0 (H) 07/09/2013   Lab Results  Component Value Date   INSULIN 17.8 12/14/2020   Lab Results  Component Value Date   TSH 1.970 12/14/2020   Lab Results  Component Value Date   CHOL 199 12/08/2020   HDL 40 12/08/2020   LDLCALC 135 12/08/2020   TRIG 137 12/08/2020   Lab Results  Component Value Date   VD25OH 27.5 (L) 12/14/2020   Lab Results  Component Value Date   WBC 5.1 12/14/2020   HGB 14.2 12/14/2020   HCT 43.8 12/14/2020   MCV 96 12/14/2020   PLT 252 12/14/2020    Attestation Statements:   Reviewed by clinician on day of visit: allergies, medications, problem list, medical history, surgical history, family history, social history, and previous encounter notes.  Edmund Hilda, CMA, am acting as transcriptionist for Reuben Likes, MD.   I have reviewed the above documentation for accuracy and completeness, and I agree with the above. - Reuben Likes, MD

## 2021-06-14 ENCOUNTER — Ambulatory Visit (INDEPENDENT_AMBULATORY_CARE_PROVIDER_SITE_OTHER): Payer: BC Managed Care – PPO | Admitting: Family Medicine

## 2021-06-26 ENCOUNTER — Other Ambulatory Visit (INDEPENDENT_AMBULATORY_CARE_PROVIDER_SITE_OTHER): Payer: Self-pay | Admitting: Family Medicine

## 2021-06-26 DIAGNOSIS — E559 Vitamin D deficiency, unspecified: Secondary | ICD-10-CM

## 2021-06-26 DIAGNOSIS — E1165 Type 2 diabetes mellitus with hyperglycemia: Secondary | ICD-10-CM

## 2021-06-26 NOTE — Telephone Encounter (Signed)
Dr.Ukleja 

## 2021-06-26 NOTE — Telephone Encounter (Signed)
LAST APPOINTMENT DATE: 05/21/21 ?NEXT APPOINTMENT DATE: 07/03/21 ? ? ?[facility] [place], [place] - [address] ?[address] ?[place] [address] ?Phone: [phone] Fax: [phone] ? ?Patient is requesting a refill of the following medications: ?Pending Prescriptions:                       Disp   Refills ?  Vitamin D, Ergocalciferol, (DRISDOL) 1.25 *4 caps*0       ?Sig: Take 1 capsule (50,000 Units total) by mouth every 7 ?         (seven) days. ?  tirzepatide (MOUNJARO) 2.5 MG/0.5ML Pen    2 mL   0       ?Sig: Inject 2.5 mg into the skin once a week. ? ?Mounjaro ?Date last filled: 05/21/21 ?Previously prescribed by Dr Lawson Radar ? ?Lab Results ?     Component                Value               Date                 ?     HGBA1C                   6.5                 12/08/2020           ?     HGBA1C                   10.0 (H)            07/09/2013           ?Lab Results ?     Component                Value               Date                 ?     LDLCALC                  135                 12/08/2020           ?     CREATININE               1.0                 12/08/2020           ?Lab Results ?     Component                Value               Date                 ?     VD25OH                   27.5 (L)            12/14/2020           ? ?BP Readings from Last 3 Encounters: ?05/21/21 : 110/73 ?04/30/21 : 112/68 ?04/05/21 : 106/69 ? ?

## 2021-07-02 ENCOUNTER — Ambulatory Visit (INDEPENDENT_AMBULATORY_CARE_PROVIDER_SITE_OTHER): Payer: BC Managed Care – PPO | Admitting: Family Medicine

## 2021-07-03 ENCOUNTER — Encounter (INDEPENDENT_AMBULATORY_CARE_PROVIDER_SITE_OTHER): Payer: Self-pay | Admitting: Family Medicine

## 2021-07-03 ENCOUNTER — Ambulatory Visit (INDEPENDENT_AMBULATORY_CARE_PROVIDER_SITE_OTHER): Payer: BC Managed Care – PPO | Admitting: Family Medicine

## 2021-07-03 VITALS — BP 107/70 | HR 75 | Temp 97.9°F | Ht 64.0 in | Wt 178.0 lb

## 2021-07-03 DIAGNOSIS — E559 Vitamin D deficiency, unspecified: Secondary | ICD-10-CM

## 2021-07-03 DIAGNOSIS — E1165 Type 2 diabetes mellitus with hyperglycemia: Secondary | ICD-10-CM

## 2021-07-03 DIAGNOSIS — Z6830 Body mass index (BMI) 30.0-30.9, adult: Secondary | ICD-10-CM

## 2021-07-03 DIAGNOSIS — E669 Obesity, unspecified: Secondary | ICD-10-CM | POA: Diagnosis not present

## 2021-07-03 DIAGNOSIS — Z7985 Long-term (current) use of injectable non-insulin antidiabetic drugs: Secondary | ICD-10-CM

## 2021-07-03 MED ORDER — VITAMIN D (ERGOCALCIFEROL) 1.25 MG (50000 UNIT) PO CAPS: 50000.0000 [IU] | ORAL_CAPSULE | ORAL | 0 refills | Status: DC

## 2021-07-03 MED ORDER — OZEMPIC (0.25 OR 0.5 MG/DOSE) 2 MG/1.5ML ~~LOC~~ SOPN: 0.2500 mg | PEN_INJECTOR | SUBCUTANEOUS | 0 refills | Status: DC

## 2021-07-05 NOTE — Progress Notes (Signed)
? ? ? ?Chief Complaint:  ? ?OBESITY ?Susan Hale is here to discuss her progress with her obesity treatment plan along with follow-up of her obesity related diagnoses. Susan Hale is on the Category 2 Plan and states she is following her eating plan approximately 80% of the time. Susan Hale states she is walking on the treadmill and in the neighborhood for 45 minutes 3-4 times per week. ? ?Today's visit was #: 9 ?Starting weight: 190 lbs ?Starting date: 12/14/2020 ?Today's weight: 178 lbs ?Today's date: 07/03/2021 ?Total lbs lost to date: 12 ?Total lbs lost since last in-office visit: 4 ? ?Interim History: Susan Hale has been working, living life over the last few weeks. She has been going outside more over the last few weeks. No hunger. Following the meal plan and feels good. For snack calories, she is eating cheese and [place] sausages. No plans for the next few weeks, except maybe going to a comedy show. Biggest obstacle is getting all of the food in. ? ?Subjective:  ? ?1. Type 2 diabetes mellitus with hyperglycemia, without long-term current use of insulin (HCC) ?Susan Hale was on Mounjaro but it was too expensive. Still interested in GLP-1. ? ?2. Vitamin D deficiency ?Susan Hale is on prescription Vitamin D. Last level was of 27.5. ? ?Assessment/Plan:  ? ?1. Type 2 diabetes mellitus with hyperglycemia, without long-term current use of insulin (HCC) ?Susan Hale agreed to switch to Ozempic 0.25 mg SubQ weekly, and stop Mounjaro.  ? ?- Semaglutide,0.25 or 0.5MG /DOS, (OZEMPIC, 0.25 OR 0.5 MG/DOSE,) 2 MG/1.5ML SOPN; Inject 0.25 mg into the skin once a week.  Dispense: 1.5 mL; Refill: 0 ? ?2. Vitamin D deficiency ?We will refill prescription Vitamin D 50,000 IU weekly for 1 month. ? ?- Vitamin D, Ergocalciferol, (DRISDOL) 1.25 MG (50000 UNIT) CAPS capsule; Take 1 capsule (50,000 Units total) by mouth every 7 (seven) days.  Dispense: 4 capsule; Refill: 0 ? ?3. Obesity with current BMI of 30.6 ?Susan Hale is currently in the action stage of change. As  such, her goal is to continue with weight loss efforts. She has agreed to the Category 2 Plan.  ? ?Exercise goals: All adults should avoid inactivity. Some physical activity is better than none, and adults who participate in any amount of physical activity gain some health benefits. She is going to start adding 10 minutes 3 times per week kettle bell workouts.  ? ?Behavioral modification strategies: increasing lean protein intake, meal planning and cooking strategies, and keeping healthy foods in the home. ? ?Susan Hale has agreed to follow-up with our clinic in 3 to 4 weeks. She was informed of the importance of frequent follow-up visits to maximize her success with intensive lifestyle modifications for her multiple health conditions.  ? ?Objective:  ? ?Blood pressure 107/70, pulse 75, temperature 97.9 ?F (36.6 ?C), height 5\' 4"  (1.626 m), weight 178 lb (80.7 kg), SpO2 98 %. ?Body mass index is 30.55 kg/m?. ? ?General: Cooperative, alert, well developed, in no acute distress. ?HEENT: Conjunctivae and lids unremarkable. ?Cardiovascular: Regular rhythm.  ?Lungs: Normal work of breathing. ?Neurologic: No focal deficits.  ? ?Lab Results  ?Component Value Date  ? CREATININE 1.0 12/08/2020  ? BUN 10 12/08/2020  ? NA 136 (A) 12/08/2020  ? K 4.0 12/08/2020  ? CL 104 12/08/2020  ? CO2 26 (A) 12/08/2020  ? ?Lab Results  ?Component Value Date  ? ALT 18 12/08/2020  ? AST 19 12/08/2020  ? ALKPHOS 49 12/08/2020  ? BILITOT 0.5 07/10/2013  ? ?Lab Results  ?Component Value Date  ?  HGBA1C 6.5 12/08/2020  ? HGBA1C 10.0 (H) 07/09/2013  ? ?Lab Results  ?Component Value Date  ? INSULIN 17.8 12/14/2020  ? ?Lab Results  ?Component Value Date  ? TSH 1.970 12/14/2020  ? ?Lab Results  ?Component Value Date  ? CHOL 199 12/08/2020  ? HDL 40 12/08/2020  ? LDLCALC 135 12/08/2020  ? TRIG 137 12/08/2020  ? ?Lab Results  ?Component Value Date  ? VD25OH 27.5 (L) 12/14/2020  ? ?Lab Results  ?Component Value Date  ? WBC 5.1 12/14/2020  ? HGB 14.2  12/14/2020  ? HCT 43.8 12/14/2020  ? MCV 96 12/14/2020  ? PLT 252 12/14/2020  ? ?No results found for: IRON, TIBC, FERRITIN ? ?Attestation Statements:  ? ?Reviewed by clinician on day of visit: allergies, medications, problem list, medical history, surgical history, family history, social history, and previous encounter notes. ? ? ?I, Burt Knack, am acting as transcriptionist for Reuben Likes, MD. ? ?I have reviewed the above documentation for accuracy and completeness, and I agree with the above. Reuben Likes, MD ? ? ?

## 2021-07-31 LAB — VITAMIN D 25 HYDROXY (VIT D DEFICIENCY, FRACTURES): Vit D, 25-Hydroxy: 38.5

## 2021-07-31 LAB — HEPATIC FUNCTION PANEL
ALT: 16 U/L (ref 7–35)
AST: 16 (ref 13–35)
Alkaline Phosphatase: 55 (ref 25–125)
Bilirubin, Total: 0.4

## 2021-07-31 LAB — CBC: RBC: 4.37 (ref 3.87–5.11)

## 2021-07-31 LAB — BASIC METABOLIC PANEL
BUN: 9 (ref 4–21)
CO2: 26 — AB (ref 13–22)
Chloride: 107 (ref 99–108)
Creatinine: 1.1 (ref 0.5–1.1)
Glucose: 89
Potassium: 4.2 mEq/L (ref 3.5–5.1)
Sodium: 137 (ref 137–147)

## 2021-07-31 LAB — CBC AND DIFFERENTIAL
HCT: 42 (ref 36–46)
Hemoglobin: 14.1 (ref 12.0–16.0)
Platelets: 232 10*3/uL (ref 150–400)
WBC: 6.8

## 2021-07-31 LAB — COMPREHENSIVE METABOLIC PANEL: Calcium: 9.1 (ref 8.7–10.7)

## 2021-08-28 ENCOUNTER — Encounter (INDEPENDENT_AMBULATORY_CARE_PROVIDER_SITE_OTHER): Payer: Self-pay | Admitting: Family Medicine

## 2021-08-28 ENCOUNTER — Ambulatory Visit (INDEPENDENT_AMBULATORY_CARE_PROVIDER_SITE_OTHER): Payer: BC Managed Care – PPO | Admitting: Family Medicine

## 2021-08-28 ENCOUNTER — Other Ambulatory Visit (INDEPENDENT_AMBULATORY_CARE_PROVIDER_SITE_OTHER): Payer: Self-pay

## 2021-08-28 VITALS — BP 104/68 | HR 92 | Temp 97.8°F | Ht 64.0 in | Wt 181.0 lb

## 2021-08-28 DIAGNOSIS — E1165 Type 2 diabetes mellitus with hyperglycemia: Secondary | ICD-10-CM

## 2021-08-28 DIAGNOSIS — Z6831 Body mass index (BMI) 31.0-31.9, adult: Secondary | ICD-10-CM | POA: Diagnosis not present

## 2021-08-28 DIAGNOSIS — E669 Obesity, unspecified: Secondary | ICD-10-CM | POA: Diagnosis not present

## 2021-08-28 DIAGNOSIS — Z7985 Long-term (current) use of injectable non-insulin antidiabetic drugs: Secondary | ICD-10-CM

## 2021-08-28 DIAGNOSIS — E559 Vitamin D deficiency, unspecified: Secondary | ICD-10-CM | POA: Diagnosis not present

## 2021-08-28 MED ORDER — OZEMPIC (0.25 OR 0.5 MG/DOSE) 2 MG/1.5ML ~~LOC~~ SOPN: 0.2500 mg | PEN_INJECTOR | SUBCUTANEOUS | 0 refills | Status: DC

## 2021-08-28 MED ORDER — VITAMIN D (ERGOCALCIFEROL) 1.25 MG (50000 UNIT) PO CAPS: 50000.0000 [IU] | ORAL_CAPSULE | ORAL | 0 refills | Status: DC

## 2021-09-04 NOTE — Progress Notes (Signed)
Chief Complaint:   OBESITY Susan Hale is here to discuss her progress with her obesity treatment plan along with follow-up of her obesity related diagnoses. Susan Hale is on the Category 2 Plan Susan states she is following her eating plan approximately 60-70% of the time. Susan Hale states she is walking Susan using the [facility] bells 35-40 minutes 2-3 times per week.  Today's visit was #: 10 Starting weight: 190 lbs Starting date: 12/14/2020 Today's weight: 181 lbs Today's date: 08/28/2021 Total lbs lost to date: 9 lbs Total lbs lost since last in-office visit: 0  Interim History: Susan Hale has been trying to stay mindful of indulgent food choices over the last few weeks.  She does feel somewhat stomach sick with diarrhea after taking Ozempic.  She wants to start exercising more consistently.   Subjective:   1. Type 2 diabetes mellitus with hyperglycemia, without long-term current use of insulin (HCC) Susan Hale's recent A1c was 5.3, Insulin level not done on 07/31/2021.  She is on Ozempic 0.25 mg weekly.   2. Vitamin D deficiency Susan Hale is on prescription Vitamin D.  She denies any nausea, vomiting, or muscle weakness, but complains of fatigue. Last Vitamin D level was 38.5.   Assessment/Plan:   1. Type 2 diabetes mellitus with hyperglycemia, without long-term current use of insulin (HCC) Susan Hale, Susan Hale, Susan Hale, Susan Hale, Susan Hale, Susan Hale. Intensive lifestyle modification including diet, exercise Susan weight Hale are the first line of treatment for diabetes.   Refill Ozempic 0.25 mg SQ every week, no refills, see below.   - Semaglutide,0.25 or 0.5MG /DOS, (OZEMPIC, 0.25 OR 0.5 MG/DOSE,) 2 MG/1.5ML SOPN; Inject 0.25 mg into the skin once a week.  Dispense: 1.5 mL; Refill: 0  2. Vitamin D deficiency Low Vitamin D level contributes to fatigue Susan are associated with obesity,  breast, Susan colon cancer. She agrees to continue to take prescription Vitamin D @50 ,000 IU every week Susan will follow-up for routine testing of Vitamin D, at least 2-3 times per year to avoid over-replacement.   Refill Vitamin D 50,000 IU weekly, #4, no refills, see below.   - Vitamin D, Ergocalciferol, (DRISDOL) 1.25 MG (50000 UNIT) CAPS capsule; Take 1 capsule (50,000 Units total) by mouth every 7 (seven) days.  Dispense: 4 capsule; Refill: 0  3. Obesity with current BMI of 31.1 Susan Hale. As such, her goal is to continue with weight Hale efforts. She has agreed to the Category 2 Plan.   Exercise goals: All adults should avoid inactivity. Some physical activity is better than none, Susan adults who participate in any amount of physical activity gain some health benefits.  Behavioral modification strategies: increasing lean protein intake, meal planning Susan cooking strategies, keeping healthy foods in the home, Susan planning for success.  Susan Hale has agreed to follow-up with our clinic in 3-4 weeks. She was informed of the importance of frequent follow-up visits to maximize her success with intensive lifestyle modifications for her multiple health conditions.   Objective:   Blood pressure 104/68, pulse 92, temperature 97.8 F (36.6 C), height 5\' 4"  (1.626 m), weight 181 lb (82.1 kg), SpO2 98 %. Body mass index is 31.07 kg/m.  General: Cooperative, alert, well developed, in no acute distress. HEENT: Conjunctivae Susan lids unremarkable. Cardiovascular: Regular rhythm.  Lungs: Normal work of breathing. Neurologic: No focal deficits.   Lab Results  Component Value  Date   CREATININE 1.1 07/31/2021   BUN 9 07/31/2021   NA 137 07/31/2021   K 4.2 07/31/2021   CL 107 07/31/2021   CO2 26 (A) 07/31/2021   Lab Results  Component Value Date   ALT 16 07/31/2021   AST 16 07/31/2021   ALKPHOS 55 07/31/2021   BILITOT 0.5 07/10/2013   Lab Results   Component Value Date   HGBA1C 6.5 12/08/2020   HGBA1C 10.0 (H) 07/09/2013   Lab Results  Component Value Date   INSULIN 17.8 12/14/2020   Lab Results  Component Value Date   TSH 1.970 12/14/2020   Lab Results  Component Value Date   CHOL 199 12/08/2020   HDL 40 12/08/2020   LDLCALC 135 12/08/2020   TRIG 137 12/08/2020   Lab Results  Component Value Date   VD25OH 38.5 07/31/2021   VD25OH 27.5 (L) 12/14/2020   Lab Results  Component Value Date   WBC 6.8 07/31/2021   HGB 14.1 07/31/2021   HCT 42 07/31/2021   MCV 96 12/14/2020   PLT 232 07/31/2021   No results found for: IRON, TIBC, FERRITIN  Attestation Statements:   Reviewed by clinician on day of visit: allergies, medications, problem list, medical history, surgical history, family history, social history, Susan previous encounter notes.  I, Malcolm Metro, am acting as transcriptionist for Reuben Likes, MD.  I have reviewed the above documentation for accuracy Susan completeness, Susan I agree with the above. - Reuben Likes, MD

## 2021-09-11 ENCOUNTER — Other Ambulatory Visit (HOSPITAL_COMMUNITY): Payer: Self-pay

## 2021-09-11 ENCOUNTER — Other Ambulatory Visit (INDEPENDENT_AMBULATORY_CARE_PROVIDER_SITE_OTHER): Payer: Self-pay | Admitting: Family Medicine

## 2021-09-11 ENCOUNTER — Telehealth (INDEPENDENT_AMBULATORY_CARE_PROVIDER_SITE_OTHER): Payer: Self-pay | Admitting: Family Medicine

## 2021-09-11 ENCOUNTER — Encounter (INDEPENDENT_AMBULATORY_CARE_PROVIDER_SITE_OTHER): Payer: Self-pay

## 2021-09-11 DIAGNOSIS — E1165 Type 2 diabetes mellitus with hyperglycemia: Secondary | ICD-10-CM

## 2021-09-11 NOTE — Telephone Encounter (Signed)
Dr. Lawson Radar - Prior authorization is not required for Ozempic per insurance. Patient sent message via mychart.

## 2021-09-12 ENCOUNTER — Other Ambulatory Visit (HOSPITAL_COMMUNITY): Payer: Self-pay

## 2021-09-13 ENCOUNTER — Other Ambulatory Visit (HOSPITAL_COMMUNITY): Payer: Self-pay

## 2021-09-19 ENCOUNTER — Other Ambulatory Visit (INDEPENDENT_AMBULATORY_CARE_PROVIDER_SITE_OTHER): Payer: Self-pay | Admitting: Family Medicine

## 2021-09-19 DIAGNOSIS — E1165 Type 2 diabetes mellitus with hyperglycemia: Secondary | ICD-10-CM

## 2021-09-25 ENCOUNTER — Ambulatory Visit (INDEPENDENT_AMBULATORY_CARE_PROVIDER_SITE_OTHER): Payer: BC Managed Care – PPO | Admitting: Family Medicine

## 2021-10-22 ENCOUNTER — Ambulatory Visit (INDEPENDENT_AMBULATORY_CARE_PROVIDER_SITE_OTHER): Payer: BC Managed Care – PPO | Admitting: Family Medicine

## 2021-10-22 ENCOUNTER — Encounter (INDEPENDENT_AMBULATORY_CARE_PROVIDER_SITE_OTHER): Payer: Self-pay | Admitting: Family Medicine

## 2021-10-22 VITALS — BP 112/74 | HR 75 | Temp 97.8°F | Ht 64.0 in | Wt 185.0 lb

## 2021-10-22 DIAGNOSIS — E669 Obesity, unspecified: Secondary | ICD-10-CM | POA: Diagnosis not present

## 2021-10-22 DIAGNOSIS — Z6831 Body mass index (BMI) 31.0-31.9, adult: Secondary | ICD-10-CM | POA: Diagnosis not present

## 2021-10-22 DIAGNOSIS — E559 Vitamin D deficiency, unspecified: Secondary | ICD-10-CM | POA: Diagnosis not present

## 2021-10-22 DIAGNOSIS — E1165 Type 2 diabetes mellitus with hyperglycemia: Secondary | ICD-10-CM | POA: Diagnosis not present

## 2021-10-22 DIAGNOSIS — Z7985 Long-term (current) use of injectable non-insulin antidiabetic drugs: Secondary | ICD-10-CM

## 2021-10-22 MED ORDER — VITAMIN D (ERGOCALCIFEROL) 1.25 MG (50000 UNIT) PO CAPS: 50000.0000 [IU] | ORAL_CAPSULE | ORAL | 0 refills | Status: DC

## 2021-10-22 MED ORDER — OZEMPIC (0.25 OR 0.5 MG/DOSE) 2 MG/1.5ML ~~LOC~~ SOPN
0.2500 mg | PEN_INJECTOR | SUBCUTANEOUS | 0 refills | Status: DC
Start: 2021-10-22 — End: 2021-11-19

## 2021-10-24 NOTE — Progress Notes (Signed)
Chief Complaint:   OBESITY Susan Hale is here to discuss her progress with her obesity treatment plan along with follow-up of her obesity related diagnoses. Susan Hale is on the Category 2 Plan + 300 and states she is following her eating plan approximately 80% of the time. Susan Hale states she is walking 30-45 minutes 4 times per week.  Today's visit was #: 11 Starting weight: 190 lbs Starting date: 12/14/2020 Today's weight: 185 lbs Today's date: 10/22/2021 Total lbs lost to date: 5 lbs Total lbs lost since last in-office visit: 0  Interim History: Susan Hale has wanted something sweet frequently and has not had much decrease in appetite on Ozempic. Fruit (plum or pear) in am with crackers. Lunch and dinner is chicken, rice and broccoli--300 calories. Thinks she needs a increase dosed of medication possibility. Likely going to beach for 4 days.  Subjective:   1. Vitamin D deficiency Susan Hale is currently taking prescription Vit D 50,000 IU once a week. Her last Vit D level of 38.5.  2. Type 2 diabetes mellitus with hyperglycemia, without long-term current use of insulin (HCC) Elanda on Ozempic 0.5 mg now. No GI side effects except minimal nausea.  Assessment/Plan:   1. Vitamin D deficiency We will refill Vit D 50,000 IU once a week for 1 month with 0 refills.  -Refill Vitamin D, Ergocalciferol, (DRISDOL) 1.25 MG (50000 UNIT) CAPS capsule; Take 1 capsule (50,000 Units total) by mouth every 7 (seven) days.  Dispense: 4 capsule; Refill: 0  2. Type 2 diabetes mellitus with hyperglycemia, without long-term current use of insulin (HCC) We will refill Ozempic 0.25 mg SubQ once weekly for 1 month with 0 refills.  -Refill Semaglutide,0.25 or 0.5MG /DOS, (OZEMPIC, 0.25 OR 0.5 MG/DOSE,) 2 MG/1.5ML SOPN; Inject 0.25 mg into the skin once a week.  Dispense: 1.5 mL; Refill: 0  3. Obesity with current BMI of 31.8 Susan Hale is currently in the action stage of change. As such, her goal is to continue with  weight loss efforts. She has agreed to the Category 2 Plan +300.   Exercise goals: All adults should avoid inactivity. Some physical activity is better than none, and adults who participate in any amount of physical activity gain some health benefits.  Behavioral modification strategies: increasing lean protein intake, meal planning and cooking strategies, and keeping healthy foods in the home.  Susan Hale has agreed to follow-up with our clinic in 4 weeks. She was informed of the importance of frequent follow-up visits to maximize her success with intensive lifestyle modifications for her multiple health conditions.   Objective:   Blood pressure 112/74, pulse 75, temperature 97.8 F (36.6 C), height 5\' 4"  (1.626 m), weight 185 lb (83.9 kg), SpO2 100 %. Body mass index is 31.76 kg/m.  General: Cooperative, alert, well developed, in no acute distress. HEENT: Conjunctivae and lids unremarkable. Cardiovascular: Regular rhythm.  Lungs: Normal work of breathing. Neurologic: No focal deficits.   Lab Results  Component Value Date   CREATININE 1.1 07/31/2021   BUN 9 07/31/2021   NA 137 07/31/2021   K 4.2 07/31/2021   CL 107 07/31/2021   CO2 26 (A) 07/31/2021   Lab Results  Component Value Date   ALT 16 07/31/2021   AST 16 07/31/2021   ALKPHOS 55 07/31/2021   BILITOT 0.5 07/10/2013   Lab Results  Component Value Date   HGBA1C 6.5 12/08/2020   HGBA1C 10.0 (H) 07/09/2013   Lab Results  Component Value Date   INSULIN 17.8 12/14/2020  Lab Results  Component Value Date   TSH 1.970 12/14/2020   Lab Results  Component Value Date   CHOL 199 12/08/2020   HDL 40 12/08/2020   LDLCALC 135 12/08/2020   TRIG 137 12/08/2020   Lab Results  Component Value Date   VD25OH 38.5 07/31/2021   VD25OH 27.5 (L) 12/14/2020   Lab Results  Component Value Date   WBC 6.8 07/31/2021   HGB 14.1 07/31/2021   HCT 42 07/31/2021   MCV 96 12/14/2020   PLT 232 07/31/2021   No results found for:  "IRON", "TIBC", "FERRITIN"  Attestation Statements:   Reviewed by clinician on day of visit: allergies, medications, problem list, medical history, surgical history, family history, social history, and previous encounter notes.  I, Fortino Sic, RMA am acting as transcriptionist for Reuben Likes, MD. I have reviewed the above documentation for accuracy and completeness, and I agree with the above. - Reuben Likes, MD

## 2021-11-07 ENCOUNTER — Encounter (INDEPENDENT_AMBULATORY_CARE_PROVIDER_SITE_OTHER): Payer: Self-pay

## 2021-11-19 ENCOUNTER — Ambulatory Visit (INDEPENDENT_AMBULATORY_CARE_PROVIDER_SITE_OTHER): Payer: BC Managed Care – PPO | Admitting: Family Medicine

## 2021-11-19 ENCOUNTER — Encounter (INDEPENDENT_AMBULATORY_CARE_PROVIDER_SITE_OTHER): Payer: Self-pay | Admitting: Family Medicine

## 2021-11-19 VITALS — BP 108/71 | HR 82 | Temp 98.4°F | Ht 64.0 in | Wt 185.0 lb

## 2021-11-19 DIAGNOSIS — Z6831 Body mass index (BMI) 31.0-31.9, adult: Secondary | ICD-10-CM

## 2021-11-19 DIAGNOSIS — E559 Vitamin D deficiency, unspecified: Secondary | ICD-10-CM

## 2021-11-19 DIAGNOSIS — E669 Obesity, unspecified: Secondary | ICD-10-CM | POA: Diagnosis not present

## 2021-11-19 DIAGNOSIS — E1165 Type 2 diabetes mellitus with hyperglycemia: Secondary | ICD-10-CM

## 2021-11-19 DIAGNOSIS — Z7985 Long-term (current) use of injectable non-insulin antidiabetic drugs: Secondary | ICD-10-CM

## 2021-11-19 MED ORDER — OZEMPIC (0.25 OR 0.5 MG/DOSE) 2 MG/1.5ML ~~LOC~~ SOPN
0.5000 mg | PEN_INJECTOR | SUBCUTANEOUS | 0 refills | Status: DC
Start: 2021-11-19 — End: 2021-12-17

## 2021-11-19 MED ORDER — VITAMIN D (ERGOCALCIFEROL) 1.25 MG (50000 UNIT) PO CAPS: 50000.0000 [IU] | ORAL_CAPSULE | ORAL | 0 refills | Status: DC

## 2021-11-29 NOTE — Progress Notes (Signed)
Chief Complaint:   OBESITY Susan Hale is here to discuss her progress with her obesity treatment plan along with follow-up of her obesity related diagnoses. Susan Hale is on the Category 2 Plan and states she is following her eating plan approximately 80% of the time. Susan Hale states she is walking 45-60 minutes 4 times per week.  Today's visit was #: 12 Starting weight: 190 lbs Starting date: 12/14/2020 Today's weight: 185 lbs Today's date: 11/19/2021 Total lbs lost to date: 5 lbs Total lbs lost since last in-office visit: 0  Interim History: Susan Hale has been focus on getting protein in. Lunch is 1 chicken breast, 1 cup broccoli and rice or sweet potato. Eating to same for dinner. Often skipping breakfast. Does notice she gets hungry after a meal. School starts agin next week so all chaos will begin again.  Subjective:   1. Type 2 diabetes mellitus with hyperglycemia, without long-term current use of insulin (HCC) Susan Hale is on Ozempic 0.25 mg. Denies GI side effects.  2. Vitamin D deficiency Susan Hale is currently taking prescription Vit D 50,000 IU once a week.  Her vit D level of 38.5. Denies any nausea, vomiting or muscle weakness. She notes fatigue.  Assessment/Plan:   1. Type 2 diabetes mellitus with hyperglycemia, without long-term current use of insulin (HCC) We will refill Ozempic 0.5 mg SubQ once weekly for 1 month with 0 refills.  -Refill Semaglutide,0.25 or 0.5MG /DOS, (OZEMPIC, 0.25 OR 0.5 MG/DOSE,) 2 MG/1.5ML SOPN; Inject 0.5 mg into the skin once a week.  Dispense: 3 mL; Refill: 0  2. Vitamin D deficiency We will refill Vit D 50K IU once a week for 1 month with 0 refills.  -Refill Vitamin D, Ergocalciferol, (DRISDOL) 1.25 MG (50000 UNIT) CAPS capsule; Take 1 capsule (50,000 Units total) by mouth every 7 (seven) days.  Dispense: 4 capsule; Refill: 0  3. Obesity with current BMI of 31.8 Susan Hale is currently in the action stage of change. As such, her goal is to continue with  weight loss efforts. She has agreed to the Category 2 Plan +100.  Exercise goals: All adults should avoid inactivity. Some physical activity is better than none, and adults who participate in any amount of physical activity gain some health benefits.  Behavioral modification strategies: increasing lean protein intake, no skipping meals, meal planning and cooking strategies, keeping healthy foods in the home, and planning for success.  Susan Hale has agreed to follow-up with our clinic in 4 weeks. She was informed of the importance of frequent follow-up visits to maximize her success with intensive lifestyle modifications for her multiple health conditions.   Objective:   Blood pressure 108/71, pulse 82, temperature 98.4 F (36.9 C), height 5\' 4"  (1.626 m), weight 185 lb (83.9 kg), SpO2 99 %. Body mass index is 31.76 kg/m.  General: Cooperative, alert, well developed, in no acute distress. HEENT: Conjunctivae and lids unremarkable. Cardiovascular: Regular rhythm.  Lungs: Normal work of breathing. Neurologic: No focal deficits.   Lab Results  Component Value Date   CREATININE 1.1 07/31/2021   BUN 9 07/31/2021   NA 137 07/31/2021   K 4.2 07/31/2021   CL 107 07/31/2021   CO2 26 (A) 07/31/2021   Lab Results  Component Value Date   ALT 16 07/31/2021   AST 16 07/31/2021   ALKPHOS 55 07/31/2021   BILITOT 0.5 07/10/2013   Lab Results  Component Value Date   HGBA1C 6.5 12/08/2020   HGBA1C 10.0 (H) 07/09/2013   Lab Results  Component Value Date   INSULIN 17.8 12/14/2020   Lab Results  Component Value Date   TSH 1.970 12/14/2020   Lab Results  Component Value Date   CHOL 199 12/08/2020   HDL 40 12/08/2020   LDLCALC 135 12/08/2020   TRIG 137 12/08/2020   Lab Results  Component Value Date   VD25OH 38.5 07/31/2021   VD25OH 27.5 (L) 12/14/2020   Lab Results  Component Value Date   WBC 6.8 07/31/2021   HGB 14.1 07/31/2021   HCT 42 07/31/2021   MCV 96 12/14/2020   PLT  232 07/31/2021   No results found for: "IRON", "TIBC", "FERRITIN"  Attestation Statements:   Reviewed by clinician on day of visit: allergies, medications, problem list, medical history, surgical history, family history, social history, and previous encounter notes.  I, Fortino Sic, RMA am acting as transcriptionist for Reuben Likes, MD. I have reviewed the above documentation for accuracy and completeness, and I agree with the above. - Reuben Likes, MD

## 2021-12-17 ENCOUNTER — Ambulatory Visit (INDEPENDENT_AMBULATORY_CARE_PROVIDER_SITE_OTHER): Payer: BC Managed Care – PPO | Admitting: Family Medicine

## 2021-12-17 ENCOUNTER — Encounter (INDEPENDENT_AMBULATORY_CARE_PROVIDER_SITE_OTHER): Payer: Self-pay | Admitting: Family Medicine

## 2021-12-17 VITALS — BP 114/65 | HR 75 | Temp 98.4°F | Ht 64.0 in | Wt 185.0 lb

## 2021-12-17 DIAGNOSIS — E1165 Type 2 diabetes mellitus with hyperglycemia: Secondary | ICD-10-CM

## 2021-12-17 DIAGNOSIS — E669 Obesity, unspecified: Secondary | ICD-10-CM | POA: Diagnosis not present

## 2021-12-17 DIAGNOSIS — Z6832 Body mass index (BMI) 32.0-32.9, adult: Secondary | ICD-10-CM

## 2021-12-17 DIAGNOSIS — Z6831 Body mass index (BMI) 31.0-31.9, adult: Secondary | ICD-10-CM

## 2021-12-17 DIAGNOSIS — E559 Vitamin D deficiency, unspecified: Secondary | ICD-10-CM

## 2021-12-17 DIAGNOSIS — Z7985 Long-term (current) use of injectable non-insulin antidiabetic drugs: Secondary | ICD-10-CM

## 2021-12-17 MED ORDER — VITAMIN D (ERGOCALCIFEROL) 1.25 MG (50000 UNIT) PO CAPS: 50000.0000 [IU] | ORAL_CAPSULE | ORAL | 0 refills | Status: DC

## 2021-12-17 MED ORDER — OZEMPIC (0.25 OR 0.5 MG/DOSE) 2 MG/1.5ML ~~LOC~~ SOPN: 0.5000 mg | PEN_INJECTOR | SUBCUTANEOUS | 0 refills | Status: DC

## 2021-12-18 NOTE — Progress Notes (Unsigned)
Chief Complaint:   OBESITY Susan Hale is here to discuss her progress with her obesity treatment plan along with follow-up of her obesity related diagnoses. Susan Hale is on the Category 2 Plan +100 and states she is following her eating plan approximately 85% of the time. Susan Hale states she is walking/lifting 45 minutes 1/1 times per week.  Today's visit was #: 13 Starting weight: 190 lbs Starting date: 12/14/2020 Today's weight: 185 lbs Today's date: 12/17/2021 Total lbs lost to date: 5 lbs Total lbs lost since last in-office visit: 0  Interim History: Susan Hale has been trying to get close to 6 oz at supper. Worried about over eating and eating when she is not hungry. This am she ate 1/2 bagel. No plans for next 2 weeks.  Subjective:   1. Vitamin D deficiency Susan Hale is currently taking prescription Vit D 50,000 IU once a week. Denies any nausea, vomiting or muscle weakness. She notes fatigue.  2. Type 2 diabetes mellitus with hyperglycemia, without long-term current use of insulin (HCC) Smt. is on Ozempic 0.5 SubQ weekly. Her last A1c was 6.5, insulin 17.8.  Assessment/Plan:   1. Vitamin D deficiency We will refill Vit D 50k IU once a week for 1 month with 0 refills.  -Refill Vitamin D, Ergocalciferol, (DRISDOL) 1.25 MG (50000 UNIT) CAPS capsule; Take 1 capsule (50,000 Units total) by mouth every 7 (seven) days.  Dispense: 4 capsule; Refill: 0  2. Type 2 diabetes mellitus with hyperglycemia, without long-term current use of insulin (HCC) We will refill Ozempic 0.5 mg SubQ once weekly for 1 month with 0 refills.  -Refill Semaglutide,0.25 or 0.5MG /DOS, (OZEMPIC, 0.25 OR 0.5 MG/DOSE,) 2 MG/1.5ML SOPN; Inject 0.5 mg into the skin once a week.  Dispense: 3 mL; Refill: 0  3. Obesity with current BMI of 31.8 Susan Hale is currently in the action stage of change. As such, her goal is to continue with weight loss efforts. She has agreed to the Category 2 Plan +100.  Exercise goals: All adults  should avoid inactivity. Some physical activity is better than none, and adults who participate in any amount of physical activity gain some health benefits.  Continue to weigh and measure food to get closer to amount of Cat 2 +100.  Behavioral modification strategies: increasing lean protein intake, meal planning and cooking strategies, keeping healthy foods in the home, and planning for success.  Susan Hale has agreed to follow-up with our clinic in 4 weeks. She was informed of the importance of frequent follow-up visits to maximize her success with intensive lifestyle modifications for her multiple health conditions.   Objective:   Blood pressure 114/65, pulse 75, temperature 98.4 F (36.9 C), height 5\' 4"  (1.626 m), weight 185 lb (83.9 kg), SpO2 99 %. Body mass index is 31.76 kg/m.  General: Cooperative, alert, well developed, in no acute distress. HEENT: Conjunctivae and lids unremarkable. Cardiovascular: Regular rhythm.  Lungs: Normal work of breathing. Neurologic: No focal deficits.   Lab Results  Component Value Date   CREATININE 1.1 07/31/2021   BUN 9 07/31/2021   NA 137 07/31/2021   K 4.2 07/31/2021   CL 107 07/31/2021   CO2 26 (A) 07/31/2021   Lab Results  Component Value Date   ALT 16 07/31/2021   AST 16 07/31/2021   ALKPHOS 55 07/31/2021   BILITOT 0.5 07/10/2013   Lab Results  Component Value Date   HGBA1C 6.5 12/08/2020   HGBA1C 10.0 (H) 07/09/2013   Lab Results  Component Value  Date   INSULIN 17.8 12/14/2020   Lab Results  Component Value Date   TSH 1.970 12/14/2020   Lab Results  Component Value Date   CHOL 199 12/08/2020   HDL 40 12/08/2020   LDLCALC 135 12/08/2020   TRIG 137 12/08/2020   Lab Results  Component Value Date   VD25OH 38.5 07/31/2021   VD25OH 27.5 (L) 12/14/2020   Lab Results  Component Value Date   WBC 6.8 07/31/2021   HGB 14.1 07/31/2021   HCT 42 07/31/2021   MCV 96 12/14/2020   PLT 232 07/31/2021   No results found  for: "IRON", "TIBC", "FERRITIN"  Attestation Statements:   Reviewed by clinician on day of visit: allergies, medications, problem list, medical history, surgical history, family history, social history, and previous encounter notes.  I, Elnora Morrison, RMA am acting as transcriptionist for Coralie Common, MD. I have reviewed the above documentation for accuracy and completeness, and I agree with the above. - Coralie Common, MD

## 2021-12-27 ENCOUNTER — Encounter (INDEPENDENT_AMBULATORY_CARE_PROVIDER_SITE_OTHER): Payer: Self-pay

## 2021-12-27 ENCOUNTER — Telehealth (INDEPENDENT_AMBULATORY_CARE_PROVIDER_SITE_OTHER): Payer: Self-pay | Admitting: Family Medicine

## 2021-12-27 NOTE — Telephone Encounter (Signed)
Dr. Jearld Shines - Prior authorization approved for Ozempic. Effective: 12/25/2021 - 12/25/2024. Patient sent approval message via mychart.

## 2022-01-14 ENCOUNTER — Ambulatory Visit (INDEPENDENT_AMBULATORY_CARE_PROVIDER_SITE_OTHER): Payer: BC Managed Care – PPO | Admitting: Family Medicine

## 2022-01-14 ENCOUNTER — Encounter (INDEPENDENT_AMBULATORY_CARE_PROVIDER_SITE_OTHER): Payer: Self-pay | Admitting: Family Medicine

## 2022-01-14 VITALS — BP 113/74 | HR 82 | Temp 97.9°F | Ht 64.0 in | Wt 183.0 lb

## 2022-01-14 DIAGNOSIS — E669 Obesity, unspecified: Secondary | ICD-10-CM

## 2022-01-14 DIAGNOSIS — Z7985 Long-term (current) use of injectable non-insulin antidiabetic drugs: Secondary | ICD-10-CM

## 2022-01-14 DIAGNOSIS — Z6831 Body mass index (BMI) 31.0-31.9, adult: Secondary | ICD-10-CM | POA: Diagnosis not present

## 2022-01-14 DIAGNOSIS — E559 Vitamin D deficiency, unspecified: Secondary | ICD-10-CM | POA: Diagnosis not present

## 2022-01-14 DIAGNOSIS — E1165 Type 2 diabetes mellitus with hyperglycemia: Secondary | ICD-10-CM | POA: Diagnosis not present

## 2022-01-14 MED ORDER — OZEMPIC (0.25 OR 0.5 MG/DOSE) 2 MG/1.5ML ~~LOC~~ SOPN
0.5000 mg | PEN_INJECTOR | SUBCUTANEOUS | 0 refills | Status: DC
Start: 2022-01-14 — End: 2022-01-31

## 2022-01-17 NOTE — Progress Notes (Signed)
Chief Complaint:   OBESITY Susan Hale is here to discuss her progress with her obesity treatment plan along with follow-up of her obesity related diagnoses. Susan Hale is on the Category 2 Plan and states she is following her eating plan approximately 85% of the time. Susan Hale states she is walking/stairs 45 minutes 5 times per week.  Today's visit was #: 14 Starting weight: 190 lbs Starting date: 12/14/2020 Today's weight: 183 lbs Today's date: 01/14/2022 Total lbs lost to date: 7 lbs Total lbs lost since last in-office visit: 2  Interim History: Susan Hale has been mostly working over the last few weeks. This past weekend she went to [place] to top golf. No hunger. Weighing chicken now but con not remember how much she is getting in. Doing grapes or [place] sausages. This plan will be easiest to follow.  Subjective:   1. Type 2 diabetes mellitus with hyperglycemia, without long-term current use of insulin (HCC) Susan Hale's last A1c 6.5, insulin 17.8. Denies GI side effects however she has not had medication due to pharmacy not filling and misunderstanding regarding her 1 year insurance approved.  2. Vitamin D deficiency Susan Hale is on weekly Vit D. Last Vit D 38.5. Denies any nausea, vomiting or muscle weakness. She notes fatigue.  Assessment/Plan:   1. Type 2 diabetes mellitus with hyperglycemia, without long-term current use of insulin (HCC) We will refill Ozempic 0.5 mg SubQ once weekly for 1 month with 0 refills.  -Refill Semaglutide,0.25 or 0.5MG /DOS, (OZEMPIC, 0.25 OR 0.5 MG/DOSE,) 2 MG/1.5ML SOPN; Inject 0.5 mg into the skin once a week.  Dispense: 3 mL; Refill: 0  2. Vitamin D deficiency Continue taking Vit D. Labs in 2 months.  3. Obesity with current BMI of 31.5 Susan Hale is currently in the action stage of change. As such, her goal is to continue with weight loss efforts. She has agreed to the Category 2 Plan +100.  Exercise goals: All adults should avoid inactivity. Some physical  activity is better than none, and adults who participate in any amount of physical activity gain some health benefits.  Behavioral modification strategies: increasing lean protein intake, meal planning and cooking strategies, keeping healthy foods in the home, and planning for success.  Susan Hale has agreed to follow-up with our clinic in 5 weeks. She was informed of the importance of frequent follow-up visits to maximize her success with intensive lifestyle modifications for her multiple health conditions.   Objective:   Blood pressure 113/74, pulse 82, temperature 97.9 F (36.6 C), height 5\' 4"  (1.626 m), weight 183 lb (83 kg), SpO2 96 %. Body mass index is 31.41 kg/m.  General: Cooperative, alert, well developed, in no acute distress. HEENT: Conjunctivae and lids unremarkable. Cardiovascular: Regular rhythm.  Lungs: Normal work of breathing. Neurologic: No focal deficits.   Lab Results  Component Value Date   CREATININE 1.1 07/31/2021   BUN 9 07/31/2021   NA 137 07/31/2021   K 4.2 07/31/2021   CL 107 07/31/2021   CO2 26 (A) 07/31/2021   Lab Results  Component Value Date   ALT 16 07/31/2021   AST 16 07/31/2021   ALKPHOS 55 07/31/2021   BILITOT 0.5 07/10/2013   Lab Results  Component Value Date   HGBA1C 6.5 12/08/2020   HGBA1C 10.0 (H) 07/09/2013   Lab Results  Component Value Date   INSULIN 17.8 12/14/2020   Lab Results  Component Value Date   TSH 1.970 12/14/2020   Lab Results  Component Value Date   CHOL  199 12/08/2020   HDL 40 12/08/2020   LDLCALC 135 12/08/2020   TRIG 137 12/08/2020   Lab Results  Component Value Date   VD25OH 38.5 07/31/2021   VD25OH 27.5 (L) 12/14/2020   Lab Results  Component Value Date   WBC 6.8 07/31/2021   HGB 14.1 07/31/2021   HCT 42 07/31/2021   MCV 96 12/14/2020   PLT 232 07/31/2021   No results found for: "IRON", "TIBC", "FERRITIN"  Attestation Statements:   Reviewed by clinician on day of visit: allergies,  medications, problem list, medical history, surgical history, family history, social history, and previous encounter notes.  I, Fortino Sic, RMA am acting as transcriptionist for Reuben Likes, MD.  I have reviewed the above documentation for accuracy and completeness, and I agree with the above. - Reuben Likes, MD

## 2022-01-31 ENCOUNTER — Other Ambulatory Visit (HOSPITAL_COMMUNITY): Payer: Self-pay

## 2022-01-31 ENCOUNTER — Encounter (INDEPENDENT_AMBULATORY_CARE_PROVIDER_SITE_OTHER): Payer: Self-pay | Admitting: Family Medicine

## 2022-01-31 ENCOUNTER — Other Ambulatory Visit (INDEPENDENT_AMBULATORY_CARE_PROVIDER_SITE_OTHER): Payer: Self-pay | Admitting: Family Medicine

## 2022-01-31 DIAGNOSIS — E559 Vitamin D deficiency, unspecified: Secondary | ICD-10-CM

## 2022-01-31 DIAGNOSIS — E1165 Type 2 diabetes mellitus with hyperglycemia: Secondary | ICD-10-CM

## 2022-01-31 MED ORDER — OZEMPIC (0.25 OR 0.5 MG/DOSE) 2 MG/3ML ~~LOC~~ SOPN
0.5000 mg | PEN_INJECTOR | SUBCUTANEOUS | 0 refills | Status: DC
  Filled 2022-01-31: qty 3, 28d supply, fill #0

## 2022-01-31 MED ORDER — VITAMIN D (ERGOCALCIFEROL) 1.25 MG (50000 UNIT) PO CAPS
50000.0000 [IU] | ORAL_CAPSULE | ORAL | 0 refills | Status: DC
  Filled 2022-01-31: qty 4, 28d supply, fill #0

## 2022-02-13 ENCOUNTER — Ambulatory Visit (INDEPENDENT_AMBULATORY_CARE_PROVIDER_SITE_OTHER): Payer: BC Managed Care – PPO | Admitting: Family Medicine

## 2022-02-28 ENCOUNTER — Ambulatory Visit (INDEPENDENT_AMBULATORY_CARE_PROVIDER_SITE_OTHER): Payer: BC Managed Care – PPO | Admitting: Family Medicine

## 2022-02-28 VITALS — BP 112/69 | HR 88 | Temp 98.4°F | Ht 64.0 in | Wt 186.0 lb

## 2022-02-28 DIAGNOSIS — E1165 Type 2 diabetes mellitus with hyperglycemia: Secondary | ICD-10-CM | POA: Diagnosis not present

## 2022-02-28 DIAGNOSIS — E785 Hyperlipidemia, unspecified: Secondary | ICD-10-CM | POA: Diagnosis not present

## 2022-02-28 DIAGNOSIS — E1169 Type 2 diabetes mellitus with other specified complication: Secondary | ICD-10-CM | POA: Diagnosis not present

## 2022-02-28 DIAGNOSIS — Z6832 Body mass index (BMI) 32.0-32.9, adult: Secondary | ICD-10-CM

## 2022-02-28 DIAGNOSIS — Z6831 Body mass index (BMI) 31.0-31.9, adult: Secondary | ICD-10-CM

## 2022-02-28 DIAGNOSIS — E559 Vitamin D deficiency, unspecified: Secondary | ICD-10-CM | POA: Diagnosis not present

## 2022-02-28 DIAGNOSIS — Z7985 Long-term (current) use of injectable non-insulin antidiabetic drugs: Secondary | ICD-10-CM

## 2022-02-28 DIAGNOSIS — E669 Obesity, unspecified: Secondary | ICD-10-CM

## 2022-02-28 MED ORDER — VITAMIN D (ERGOCALCIFEROL) 1.25 MG (50000 UNIT) PO CAPS
50000.0000 [IU] | ORAL_CAPSULE | ORAL | 0 refills | Status: DC
  Filled 2022-02-28: qty 4, 28d supply, fill #0

## 2022-02-28 MED ORDER — OZEMPIC (0.25 OR 0.5 MG/DOSE) 2 MG/3ML ~~LOC~~ SOPN
0.5000 mg | PEN_INJECTOR | SUBCUTANEOUS | 0 refills | Status: DC
  Filled 2022-02-28: qty 3, 28d supply, fill #0

## 2022-03-01 ENCOUNTER — Other Ambulatory Visit (HOSPITAL_COMMUNITY): Payer: Self-pay

## 2022-03-14 NOTE — Progress Notes (Signed)
Chief Complaint:   OBESITY Susan Hale is here to discuss her progress with her obesity treatment plan along with follow-up of her obesity related diagnoses. Susan Hale is on the Category 2 Plan and states she is following her eating plan approximately 25% of the time. Susan Hale states she is walking 1 mile  7 times per week.  Today's visit was #: 15 Starting weight: 190 lbs Starting date: 12/14/2020 Today's weight: 186 lbs Today's date: 02/28/2022 Total lbs lost to date: 4 lbs Total lbs lost since last in-office visit: 0  Interim History: Susan Hale's mother is currently hospitalized after aneurysm bursting. She has been doing grab and go options. Mom will be going to inpatient rehab at the hospital. [place] coming up but she is not sure what she will do.  Subjective:   1. Type 2 diabetes mellitus with hyperglycemia, without long-term current use of insulin (HCC) Susan Hale's A1c was 5.6 on 01/31/22 at PCP---significant improvement from A1c of 6.5.  2. Hyperlipidemia associated with type 2 diabetes mellitus (HCC) HDL of 45, Trigly of 80, LDL of 121. Not on medication.  3. Vitamin D deficiency Last Vit D level of 43.2. Denies any nausea, vomiting or muscle weakness.  Assessment/Plan:   1. Type 2 diabetes mellitus with hyperglycemia, without long-term current use of insulin (HCC) We will refill Ozempic 0.5 mg SubQ once weekly for 1 month with 0 refills.  -Refill Semaglutide,0.25 or 0.5MG /DOS, (OZEMPIC, 0.25 OR 0.5 MG/DOSE,) 2 MG/3ML SOPN; Inject 0.5 mg into the skin once a week.  Dispense: 3 mL; Refill: 0  2. Hyperlipidemia associated with type 2 diabetes mellitus (HCC) Need to address medications as LDL only minimally decreased. Patient wants to think on medication.  3. Vitamin D deficiency We will refill Vit D 50K IU once a week for 1 month with 0 refills.  -Refill Vitamin D, Ergocalciferol, (DRISDOL) 1.25 MG (50000 UNIT) CAPS capsule; Take 1 capsule (50,000 Units total) by mouth every 7  (seven) days.  Dispense: 4 capsule; Refill: 0  4. Obesity with current BMI of 31.9 Susan Hale is currently in the action stage of change. As such, her goal is to continue with weight loss efforts. She has agreed to the Category 2 Plan and keeping a food journal and adhering to recommended goals of 300-400 for lunch 400-500 for dinner calories and 30+ grams and 40+ grams protein.   Exercise goals: All adults should avoid inactivity. Some physical activity is better than none, and adults who participate in any amount of physical activity gain some health benefits.  Behavioral modification strategies: increasing lean protein intake, no skipping meals, meal planning and cooking strategies, and keeping healthy foods in the home.  Susan Hale has agreed to follow-up with our clinic in 3 weeks. She was informed of the importance of frequent follow-up visits to maximize her success with intensive lifestyle modifications for her multiple health conditions.   Objective:   Blood pressure 112/69, pulse 88, temperature 98.4 F (36.9 C), height 5\' 4"  (1.626 m), weight 186 lb (84.4 kg), SpO2 99 %. Body mass index is 31.93 kg/m.  General: Cooperative, alert, well developed, in no acute distress. HEENT: Conjunctivae and lids unremarkable. Cardiovascular: Regular rhythm.  Lungs: Normal work of breathing. Neurologic: No focal deficits.   Lab Results  Component Value Date   CREATININE 1.1 07/31/2021   BUN 9 07/31/2021   NA 137 07/31/2021   K 4.2 07/31/2021   CL 107 07/31/2021   CO2 26 (A) 07/31/2021   Lab Results  Component Value Date   ALT 16 07/31/2021   AST 16 07/31/2021   ALKPHOS 55 07/31/2021   BILITOT 0.5 07/10/2013   Lab Results  Component Value Date   HGBA1C 6.5 12/08/2020   HGBA1C 10.0 (H) 07/09/2013   Lab Results  Component Value Date   INSULIN 17.8 12/14/2020   Lab Results  Component Value Date   TSH 1.970 12/14/2020   Lab Results  Component Value Date   CHOL 199 12/08/2020    HDL 40 12/08/2020   LDLCALC 135 12/08/2020   TRIG 137 12/08/2020   Lab Results  Component Value Date   VD25OH 38.5 07/31/2021   VD25OH 27.5 (L) 12/14/2020   Lab Results  Component Value Date   WBC 6.8 07/31/2021   HGB 14.1 07/31/2021   HCT 42 07/31/2021   MCV 96 12/14/2020   PLT 232 07/31/2021   No results found for: "IRON", "TIBC", "FERRITIN"  Attestation Statements:   Reviewed by clinician on day of visit: allergies, medications, problem list, medical history, surgical history, family history, social history, and previous encounter notes.  I, Fortino Sic, RMA am acting as transcriptionist for Reuben Likes, MD.  I have reviewed the above documentation for accuracy and completeness, and I agree with the above. - Reuben Likes, MD

## 2022-04-02 ENCOUNTER — Encounter (INDEPENDENT_AMBULATORY_CARE_PROVIDER_SITE_OTHER): Payer: Self-pay | Admitting: Family Medicine

## 2022-04-02 ENCOUNTER — Ambulatory Visit (INDEPENDENT_AMBULATORY_CARE_PROVIDER_SITE_OTHER): Payer: BC Managed Care – PPO | Admitting: Family Medicine

## 2022-04-02 ENCOUNTER — Other Ambulatory Visit (HOSPITAL_COMMUNITY): Payer: Self-pay

## 2022-04-02 VITALS — BP 100/67 | HR 82 | Temp 98.1°F | Ht 64.0 in | Wt 183.0 lb

## 2022-04-02 DIAGNOSIS — E1165 Type 2 diabetes mellitus with hyperglycemia: Secondary | ICD-10-CM | POA: Diagnosis not present

## 2022-04-02 DIAGNOSIS — Z7985 Long-term (current) use of injectable non-insulin antidiabetic drugs: Secondary | ICD-10-CM

## 2022-04-02 DIAGNOSIS — E559 Vitamin D deficiency, unspecified: Secondary | ICD-10-CM

## 2022-04-02 DIAGNOSIS — Z6831 Body mass index (BMI) 31.0-31.9, adult: Secondary | ICD-10-CM

## 2022-04-02 DIAGNOSIS — E669 Obesity, unspecified: Secondary | ICD-10-CM | POA: Diagnosis not present

## 2022-04-02 MED ORDER — OZEMPIC (0.25 OR 0.5 MG/DOSE) 2 MG/3ML ~~LOC~~ SOPN
0.5000 mg | PEN_INJECTOR | SUBCUTANEOUS | 0 refills | Status: DC
  Filled 2022-04-02: qty 3, 28d supply, fill #0

## 2022-04-02 MED ORDER — VITAMIN D (ERGOCALCIFEROL) 1.25 MG (50000 UNIT) PO CAPS
50000.0000 [IU] | ORAL_CAPSULE | ORAL | 0 refills | Status: DC
  Filled 2022-04-02: qty 4, 28d supply, fill #0

## 2022-04-03 ENCOUNTER — Other Ambulatory Visit (HOSPITAL_COMMUNITY): Payer: Self-pay

## 2022-04-14 NOTE — Progress Notes (Signed)
Chief Complaint:   OBESITY Susan Hale is here to discuss her progress with her obesity treatment plan along with follow-up of her obesity related diagnoses. Susan Hale is on the Category 2 Plan and states she is following her eating plan approximately 80% of the time. Susan Hale states she is doing housework 20 minutes 7 times per week.  Today's visit was #: 79 Starting weight: 190 lbs Starting date: 12/14/2020 Today's weight: 183 lbs Today's date: 04/02/2022 Total lbs lost to date: 7 lbs Total lbs lost since last in-office visit: 3  Interim History: Susan Hale has a good holiday season--stayed local and mostly healthy.  Has been drinking Prime Protein shakes for days she has not been able to get all food in.  Next few weeks shr is attending doctor appointments with her mom.  Subjective:   1. Vitamin D deficiency Susan Hale is currently taking prescription Vit D 50,000 IU once a week.  Denies any nausea, vomiting or muscle weakness.  She notes fatigue.  2. Type 2 diabetes mellitus with hyperglycemia, without long-term current use of insulin (HCC) Susan Hale is on Ozempic--Denies GI side effects.  Better control of blood sugars.  Assessment/Plan:   1. Vitamin D deficiency We will refill Vit D 50k IU once weekly for 1  month with 0 refills.  -Refill Vitamin D, Ergocalciferol, (DRISDOL) 1.25 MG (50000 UNIT) CAPS capsule; Take 1 capsule (50,000 Units total) by mouth every 7 (seven) days.  Dispense: 4 capsule; Refill: 0  2. Type 2 diabetes mellitus with hyperglycemia, without long-term current use of insulin (HCC) We will refill Ozempic 0.5 mg SubQ once weekly for 1 month with 0 refills.  -Refill Semaglutide,0.25 or 0.5MG /DOS, (OZEMPIC, 0.25 OR 0.5 MG/DOSE,) 2 MG/3ML SOPN; Inject 0.5 mg into the skin once a week.  Dispense: 3 mL; Refill: 0  3. Obesity with current BMI of 31.5 Susan Hale is currently in the action stage of change. As such, her goal is to continue with weight loss efforts. She has agreed to  the Category 2 Plan.   Exercise goals: All adults should avoid inactivity. Some physical activity is better than none, and adults who participate in any amount of physical activity gain some health benefits.  Behavioral modification strategies: increasing lean protein intake, meal planning and cooking strategies, and planning for success.  Susan Hale has agreed to follow-up with our clinic in 4 weeks. She was informed of the importance of frequent follow-up visits to maximize her success with intensive lifestyle modifications for her multiple health conditions.   Objective:   Blood pressure 100/67, pulse 82, temperature 98.1 F (36.7 C), height 5\' 4"  (1.626 m), weight 183 lb (83 kg), SpO2 98 %. Body mass index is 31.41 kg/m.  General: Cooperative, alert, well developed, in no acute distress. HEENT: Conjunctivae and lids unremarkable. Cardiovascular: Regular rhythm.  Lungs: Normal work of breathing. Neurologic: No focal deficits.   Lab Results  Component Value Date   CREATININE 1.1 07/31/2021   BUN 9 07/31/2021   NA 137 07/31/2021   K 4.2 07/31/2021   CL 107 07/31/2021   CO2 26 (A) 07/31/2021   Lab Results  Component Value Date   ALT 16 07/31/2021   AST 16 07/31/2021   ALKPHOS 55 07/31/2021   BILITOT 0.5 07/10/2013   Lab Results  Component Value Date   HGBA1C 6.5 12/08/2020   HGBA1C 10.0 (H) 07/09/2013   Lab Results  Component Value Date   INSULIN 17.8 12/14/2020   Lab Results  Component Value Date  TSH 1.970 12/14/2020   Lab Results  Component Value Date   CHOL 199 12/08/2020   HDL 40 12/08/2020   LDLCALC 135 12/08/2020   TRIG 137 12/08/2020   Lab Results  Component Value Date   VD25OH 38.5 07/31/2021   VD25OH 27.5 (L) 12/14/2020   Lab Results  Component Value Date   WBC 6.8 07/31/2021   HGB 14.1 07/31/2021   HCT 42 07/31/2021   MCV 96 12/14/2020   PLT 232 07/31/2021   No results found for: "IRON", "TIBC", "FERRITIN"  Attestation Statements:    Reviewed by clinician on day of visit: allergies, medications, problem list, medical history, surgical history, family history, social history, and previous encounter notes.  I, Elnora Morrison, RMA am acting as transcriptionist for Coralie Common, MD.  I have reviewed the above documentation for accuracy and completeness, and I agree with the above. - Coralie Common, MD

## 2022-04-28 IMAGING — MG MM DIGITAL DIAGNOSTIC UNILAT*R* W/ TOMO W/ CAD
8 series · 9 of 24 positions shown · non-contrast
Comparison: Previous exam(s).

CLINICAL DATA: Patient was called back from screening mammogram for
a possible asymmetry in the right breast.

EXAM:
DIGITAL DIAGNOSTIC RIGHT MAMMOGRAM WITH CAD AND TOMO
ULTRASOUND RIGHT BREAST

[R CC synth-2D (1 of 2)]
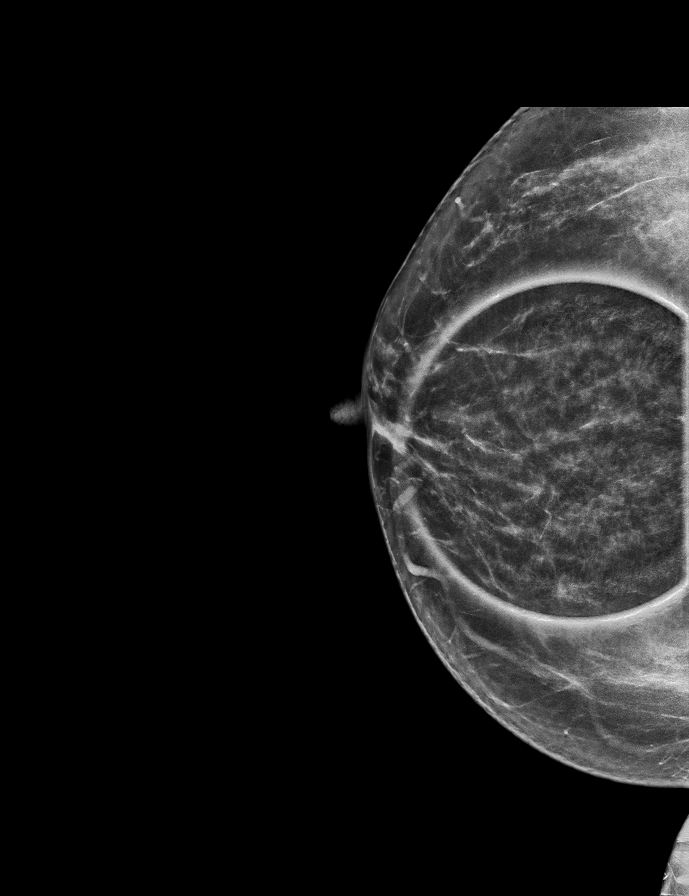

[R CC synth-2D (2 of 2)]
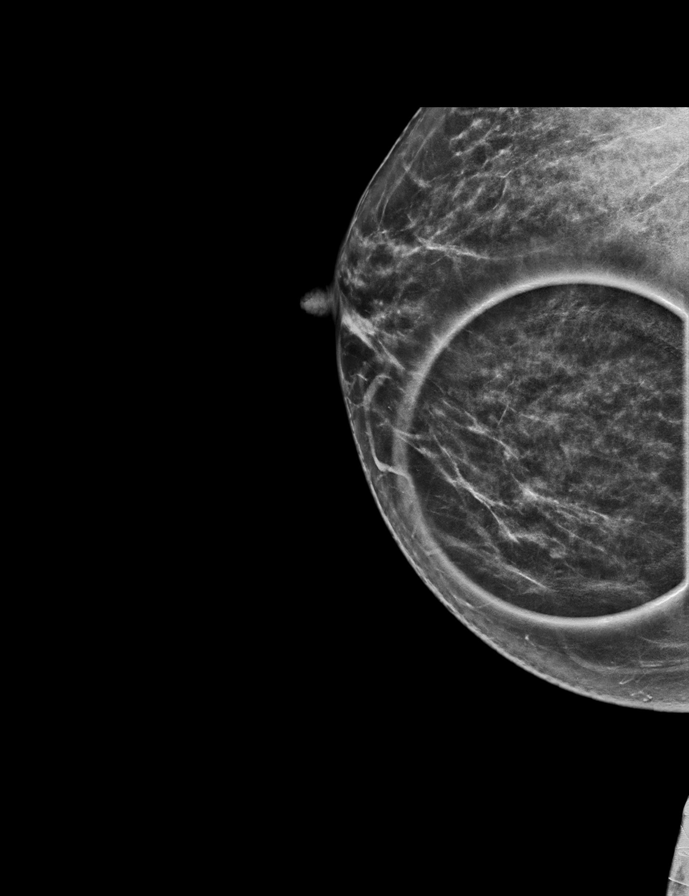

[R MLO synth-2D]
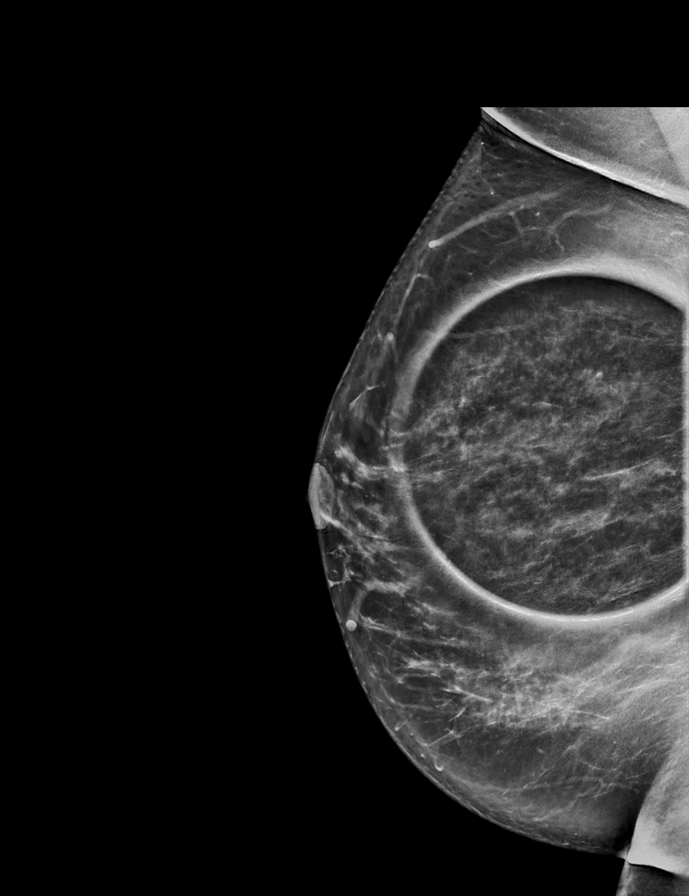

[R ML synth-2D]
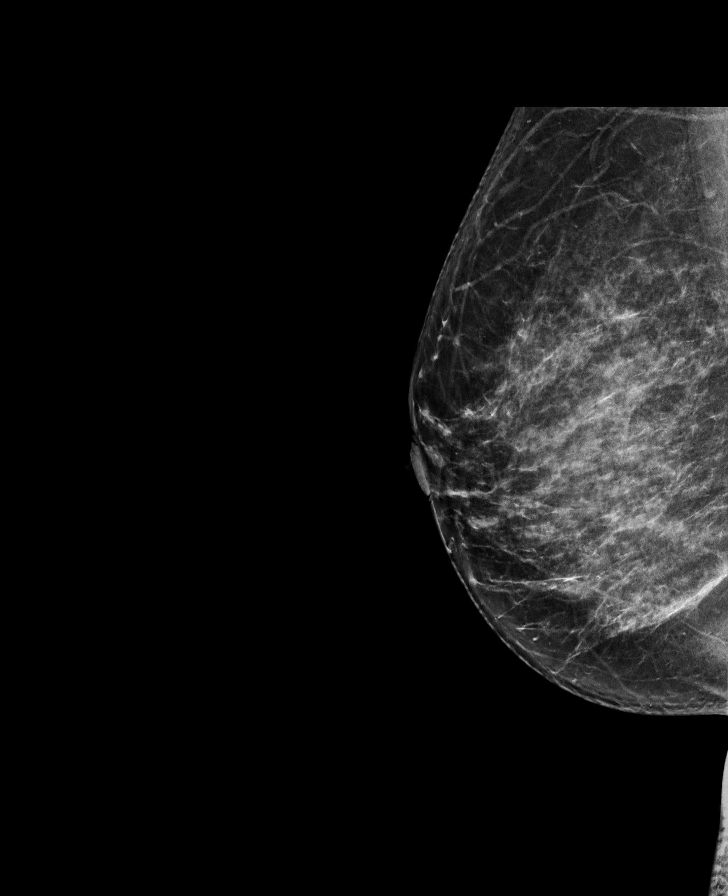

[R MLO tomo · 2 of 77 frames shown]
[frame 25/77]
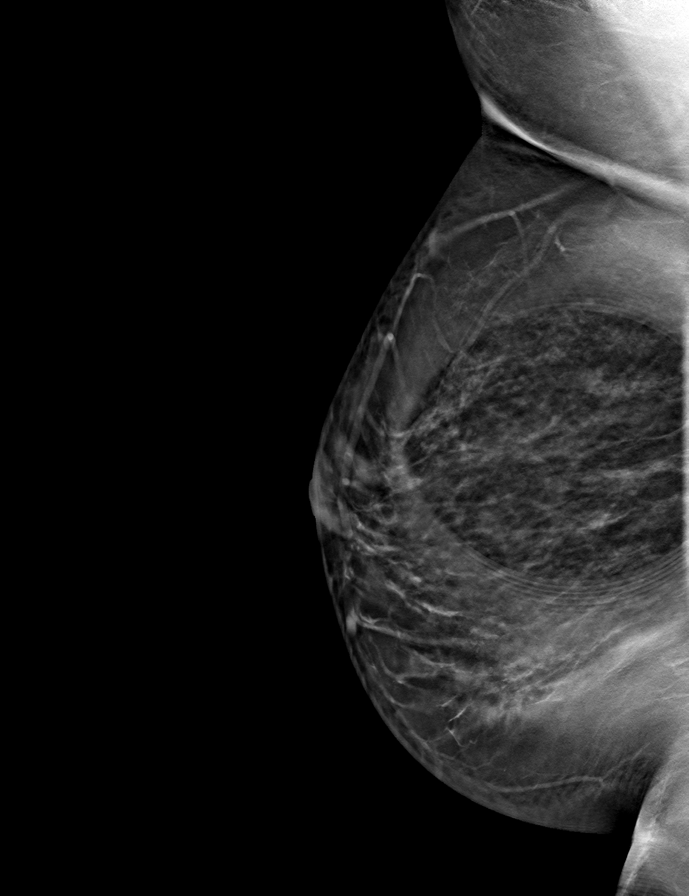
[frame 39/77]
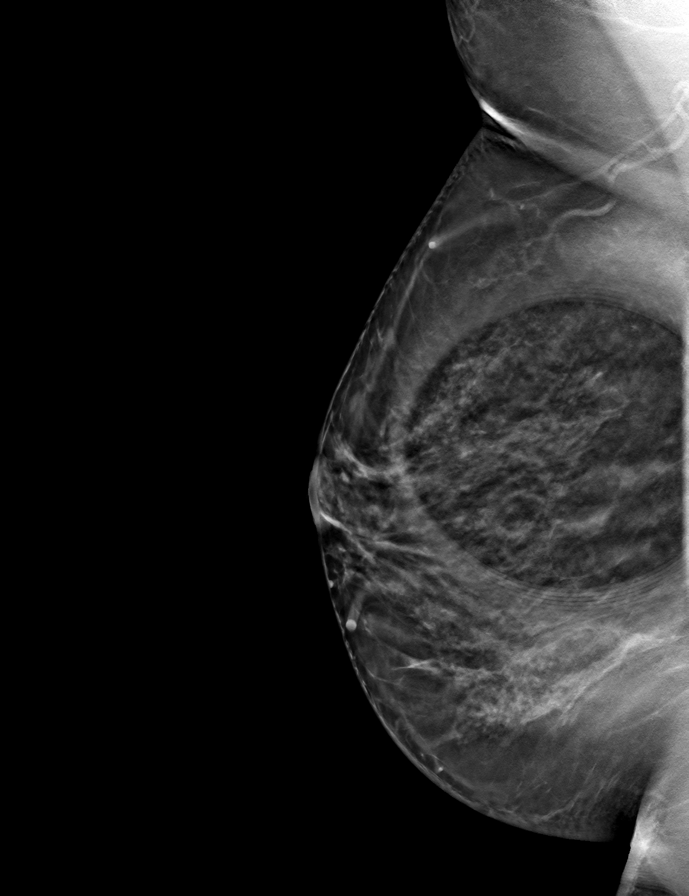

[R CC tomo (1 of 2) · tomo slice 35/68.0]
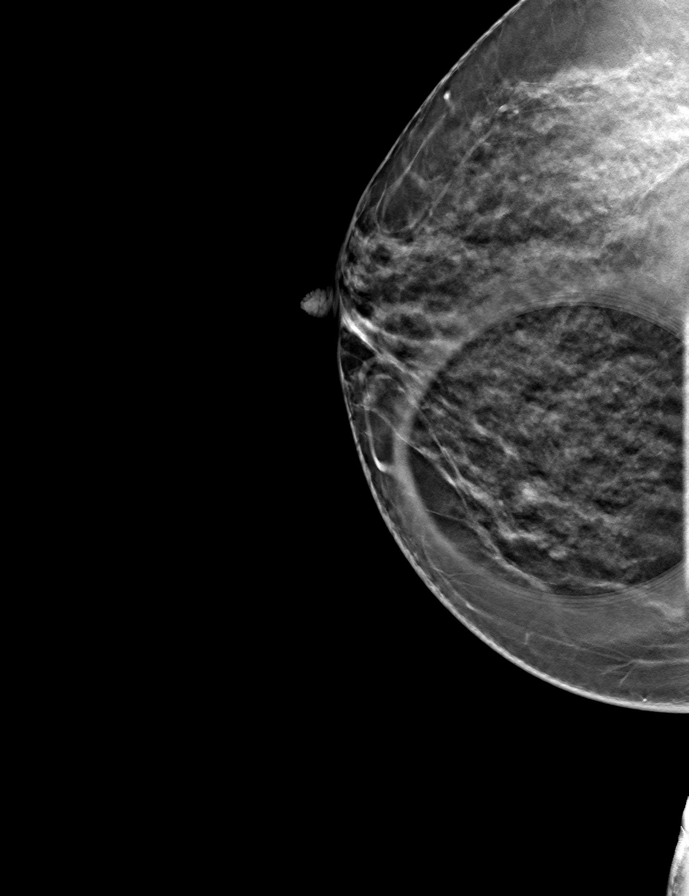

[R CC tomo (2 of 2) · tomo slice 35/69.0]
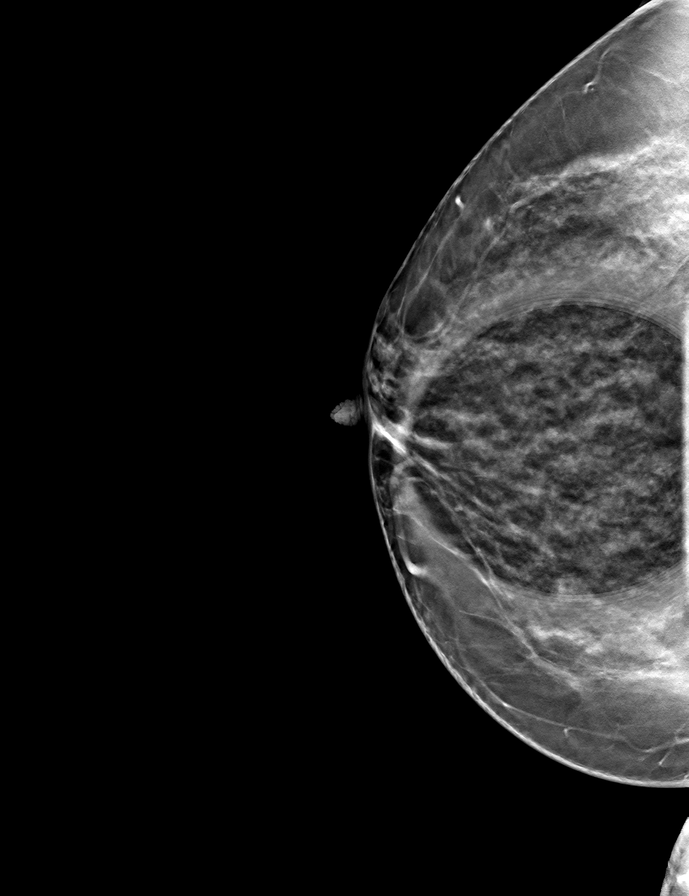

[R ML tomo · tomo slice 41/82.0]
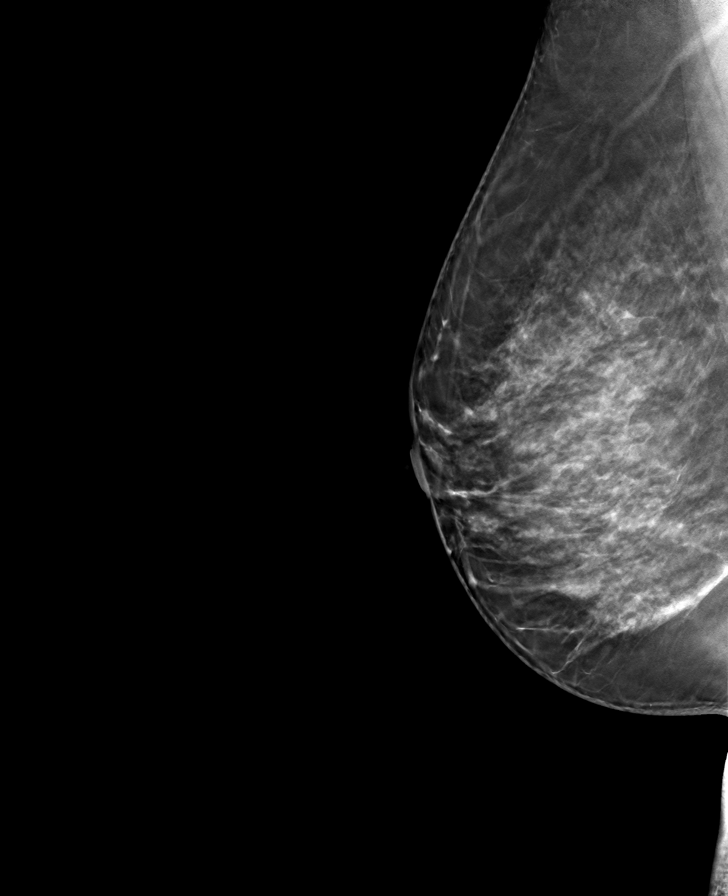

[9 of 24 positions shown; findings below may reference images not displayed]

ACR Breast Density Category b: There are scattered areas of
fibroglandular density.
FINDINGS: Additional imaging of the right breast was performed. There is no
persistent mass, distortion or malignant type microcalcifications in
the right breast.

Mammographic images were processed with CAD.

Targeted ultrasound is performed, showing normal tissue in the
upper-inner quadrant of the right breast. No solid or cystic mass,
abnormal shadowing or distortion visualized.
IMPRESSION: No evidence of malignancy in the right breast.

RECOMMENDATION:
Bilateral screening mammogram in 1 year is recommended.

I have discussed the findings and recommendations with the patient.
If applicable, a reminder letter will be sent to the patient
regarding the next appointment.

BI-RADS CATEGORY  1: Negative.

## 2022-04-30 ENCOUNTER — Ambulatory Visit (INDEPENDENT_AMBULATORY_CARE_PROVIDER_SITE_OTHER): Payer: BC Managed Care – PPO | Admitting: Family Medicine

## 2022-05-07 ENCOUNTER — Ambulatory Visit (INDEPENDENT_AMBULATORY_CARE_PROVIDER_SITE_OTHER): Payer: BC Managed Care – PPO | Admitting: Family Medicine

## 2022-05-07 ENCOUNTER — Other Ambulatory Visit (HOSPITAL_COMMUNITY): Payer: Self-pay

## 2022-05-07 ENCOUNTER — Encounter (INDEPENDENT_AMBULATORY_CARE_PROVIDER_SITE_OTHER): Payer: Self-pay | Admitting: Family Medicine

## 2022-05-07 VITALS — BP 131/76 | HR 80 | Temp 98.1°F | Ht 64.0 in | Wt 180.0 lb

## 2022-05-07 DIAGNOSIS — E669 Obesity, unspecified: Secondary | ICD-10-CM | POA: Diagnosis not present

## 2022-05-07 DIAGNOSIS — Z7985 Long-term (current) use of injectable non-insulin antidiabetic drugs: Secondary | ICD-10-CM

## 2022-05-07 DIAGNOSIS — Z6831 Body mass index (BMI) 31.0-31.9, adult: Secondary | ICD-10-CM

## 2022-05-07 DIAGNOSIS — E559 Vitamin D deficiency, unspecified: Secondary | ICD-10-CM | POA: Diagnosis not present

## 2022-05-07 DIAGNOSIS — E1165 Type 2 diabetes mellitus with hyperglycemia: Secondary | ICD-10-CM | POA: Diagnosis not present

## 2022-05-07 MED ORDER — OZEMPIC (0.25 OR 0.5 MG/DOSE) 2 MG/3ML ~~LOC~~ SOPN
0.5000 mg | PEN_INJECTOR | SUBCUTANEOUS | 0 refills | Status: DC
  Filled 2022-05-07: qty 3, 28d supply, fill #0

## 2022-05-07 MED ORDER — VITAMIN D (ERGOCALCIFEROL) 1.25 MG (50000 UNIT) PO CAPS
50000.0000 [IU] | ORAL_CAPSULE | ORAL | 0 refills | Status: DC
  Filled 2022-05-07: qty 4, 28d supply, fill #0

## 2022-05-22 NOTE — Progress Notes (Signed)
Chief Complaint:   OBESITY Susan Hale is here to discuss her progress with her obesity treatment plan along with follow-up of her obesity related diagnoses. Susan Hale is on the Category 2 Plan and states she is following her eating plan approximately 85% of the time. Susan Hale states she is using a stepper 25-30 minutes 3-4 times per week.  Today's visit was #: 31 Starting weight: 190 lbs Starting date: 12/14/2020 Today's weight: 180 lbs Today's date: 05/07/2022 Total lbs lost to date: 10 lbs Total lbs lost since last in-office visit: 3  Interim History: Susan Hale has been eating more protein (1-1.5 s.chicken), vegetables, yogurt and fruit.  She is not hungry.  Today she has skipped most meals.  Does not have anything coming up.  Not sure what she wants to eat but wants other options.  May be interested in other breakfast options, like cereal.  Subjective:   1. Vitamin D deficiency Last Vit D level on 11/2/223 of 43.2.  Notes fatigue.  2. Type 2 diabetes mellitus with hyperglycemia, without long-term current use of insulin (HCC) On Ozempic weekly.  Denies GI side effects.   Assessment/Plan:   1. Vitamin D deficiency We will refill Vit D 50K IU once a week for 1 month with 0 refills.  -Refill Vitamin D, Ergocalciferol, (DRISDOL) 1.25 MG (50000 UNIT) CAPS capsule; Take 1 capsule (50,000 Units total) by mouth every 7 (seven) days.  Dispense: 4 capsule; Refill: 0  2. Type 2 diabetes mellitus with hyperglycemia, without long-term current use of insulin (HCC) Will refill Ozempic 0.5 mg SubQ once a week for 1 month with 0 refills.  -Refill Semaglutide,0.25 or 0.'5MG'$ /DOS, (OZEMPIC, 0.25 OR 0.5 MG/DOSE,) 2 MG/3ML SOPN; Inject 0.5 mg into the skin once a week.  Dispense: 3 mL; Refill: 0  3. Obesity with current BMI of 31.0 Susan Hale is currently in the action stage of change. As such, her goal is to continue with weight loss efforts. She has agreed to the Category 2 Plan.   Exercise goals: All  adults should avoid inactivity. Some physical activity is better than none, and adults who participate in any amount of physical activity gain some health benefits.  Behavioral modification strategies: increasing lean protein intake, meal planning and cooking strategies, keeping healthy foods in the home, and planning for success.  Susan Hale has agreed to follow-up with our clinic in 4 weeks. She was informed of the importance of frequent follow-up visits to maximize her success with intensive lifestyle modifications for her multiple health conditions.   Objective:   Blood pressure 131/76, pulse 80, temperature 98.1 F (36.7 C), height '5\' 4"'$  (1.626 m), weight 180 lb (81.6 kg), SpO2 100 %. Body mass index is 30.9 kg/m.  General: Cooperative, alert, well developed, in no acute distress. HEENT: Conjunctivae and lids unremarkable. Cardiovascular: Regular rhythm.  Lungs: Normal work of breathing. Neurologic: No focal deficits.   Lab Results  Component Value Date   CREATININE 1.1 07/31/2021   BUN 9 07/31/2021   NA 137 07/31/2021   K 4.2 07/31/2021   CL 107 07/31/2021   CO2 26 (A) 07/31/2021   Lab Results  Component Value Date   ALT 16 07/31/2021   AST 16 07/31/2021   ALKPHOS 55 07/31/2021   BILITOT 0.5 07/10/2013   Lab Results  Component Value Date   HGBA1C 6.5 12/08/2020   HGBA1C 10.0 (H) 07/09/2013   Lab Results  Component Value Date   INSULIN 17.8 12/14/2020   Lab Results  Component Value  Date   TSH 1.970 12/14/2020   Lab Results  Component Value Date   CHOL 199 12/08/2020   HDL 40 12/08/2020   LDLCALC 135 12/08/2020   TRIG 137 12/08/2020   Lab Results  Component Value Date   VD25OH 38.5 07/31/2021   VD25OH 27.5 (L) 12/14/2020   Lab Results  Component Value Date   WBC 6.8 07/31/2021   HGB 14.1 07/31/2021   HCT 42 07/31/2021   MCV 96 12/14/2020   PLT 232 07/31/2021   No results found for: "IRON", "TIBC", "FERRITIN"  Attestation Statements:   Reviewed  by clinician on day of visit: allergies, medications, problem list, medical history, surgical history, family history, social history, and previous encounter notes.  I, Elnora Morrison, RMA am acting as transcriptionist for Coralie Common, MD. I have reviewed the above documentation for accuracy and completeness, and I agree with the above. - Coralie Common, MD

## 2022-06-04 ENCOUNTER — Other Ambulatory Visit (HOSPITAL_COMMUNITY): Payer: Self-pay

## 2022-06-04 ENCOUNTER — Ambulatory Visit (INDEPENDENT_AMBULATORY_CARE_PROVIDER_SITE_OTHER): Payer: BC Managed Care – PPO | Admitting: Family Medicine

## 2022-06-04 ENCOUNTER — Encounter (INDEPENDENT_AMBULATORY_CARE_PROVIDER_SITE_OTHER): Payer: Self-pay | Admitting: Family Medicine

## 2022-06-04 VITALS — BP 102/70 | HR 80 | Temp 98.2°F | Ht 64.0 in | Wt 180.0 lb

## 2022-06-04 DIAGNOSIS — E1165 Type 2 diabetes mellitus with hyperglycemia: Secondary | ICD-10-CM

## 2022-06-04 DIAGNOSIS — Z6831 Body mass index (BMI) 31.0-31.9, adult: Secondary | ICD-10-CM

## 2022-06-04 DIAGNOSIS — E669 Obesity, unspecified: Secondary | ICD-10-CM | POA: Diagnosis not present

## 2022-06-04 DIAGNOSIS — E559 Vitamin D deficiency, unspecified: Secondary | ICD-10-CM

## 2022-06-04 DIAGNOSIS — Z7985 Long-term (current) use of injectable non-insulin antidiabetic drugs: Secondary | ICD-10-CM

## 2022-06-04 MED ORDER — OZEMPIC (0.25 OR 0.5 MG/DOSE) 2 MG/3ML ~~LOC~~ SOPN
0.5000 mg | PEN_INJECTOR | SUBCUTANEOUS | 0 refills | Status: DC
  Filled 2022-06-04: qty 9, 84d supply, fill #0
  Filled 2022-06-10: qty 3, 28d supply, fill #0

## 2022-06-04 MED ORDER — VITAMIN D (ERGOCALCIFEROL) 1.25 MG (50000 UNIT) PO CAPS
50000.0000 [IU] | ORAL_CAPSULE | ORAL | 0 refills | Status: DC
  Filled 2022-06-04: qty 4, 28d supply, fill #0

## 2022-06-04 NOTE — Progress Notes (Deleted)
Since last appointment she is working on getting all food in.  She has been working out more frequently and doing a stair stepper with resistance bands before bed. She is able to get a piece of chicken and a half and maybe some shrimp with vegetable at dinner.  Lunch is a burger without bun and sweet potato fries or half of chicken and maybe some chips or something.  Was wondering about cutting out meat and incorporating all vegetables.  This weekend she is supposed to go to [place] and bringing her daughter a balloon place and the slime museum.  Next appointment with Dr. Mancel Bale is either this month or May.

## 2022-06-10 ENCOUNTER — Other Ambulatory Visit (HOSPITAL_COMMUNITY): Payer: Self-pay

## 2022-06-12 NOTE — Progress Notes (Signed)
Chief Complaint:   OBESITY Susan Hale is here to discuss her progress with her obesity treatment plan along with follow-up of her obesity related diagnoses. Susan Hale is on the Category 2 Plan and states she is following her eating plan approximately 75% of the time. Susan Hale states she is stepper and bands 20-30  minutes 7 times per week.  Today's visit was #: 57 Starting weight: 190 LBS Starting date: 12/14/2020 Today's weight: 180 LBS Today's date: 06/04/2022 Total lbs lost to date: 10 LBS Total lbs lost since last in-office visit: 0  Interim History: Since last appointment she is working on getting all food in. She has been working out more frequently and doing a stair stepper with resistance bands before bed. She is able to get a piece of chicken and a half and maybe some shrimp with vegetable at dinner. Lunch is a burger without bun and sweet potato fries or half of chicken and maybe some chips or something. Was wondering about cutting out meat and incorporating all vegetables. This weekend she is supposed to go to [place] and bringing her daughter a balloon place and the slime museum. Next appointment with Dr. Mancel Bale is either this month or May.   Subjective:   1. Vitamin D deficiency Patient is on prescription vitamin D.  Last vitamin D level still not at goal.  2. Type 2 diabetes mellitus with hyperglycemia, without long-term current use of insulin (HCC) Patient's recent A1c was 5.6.  Patient denies GI side effects of Ozempic.  Patient has significant satiety.  Assessment/Plan:   1. Vitamin D deficiency refill- Vitamin D, Ergocalciferol, (DRISDOL) 1.25 MG (50000 UNIT) CAPS capsule; Take 1 capsule (50,000 Units total) by mouth every 7 (seven) days.  Dispense: 4 capsule; Refill: 0  2. Type 2 diabetes mellitus with hyperglycemia, without long-term current use of insulin (HCC) refill- Semaglutide,0.25 or 0.'5MG'$ /DOS, (OZEMPIC, 0.25 OR 0.5 MG/DOSE,) 2 MG/3ML SOPN; Inject 0.5 mg into the  skin once a week.  Dispense: 9 mL; Refill: 0  3. Obesity with current BMI of 31.0 Susan Hale is currently in the action stage of change. As such, her goal is to continue with weight loss efforts. She has agreed to the Category 2 Plan and the Susan Hale.   Exercise goals:  As is.  Behavioral modification strategies: increasing lean protein intake, meal planning and cooking strategies, keeping healthy foods in the home, and planning for success.  Susan Hale has agreed to follow-up with our clinic in 4 weeks. She was informed of the importance of frequent follow-up visits to maximize her success with intensive lifestyle modifications for her multiple health conditions.   Objective:   Blood pressure 102/70, pulse 80, temperature 98.2 F (36.8 C), height '5\' 4"'$  (1.626 m), weight 180 lb (81.6 kg), SpO2 99 %. Body mass index is 30.9 kg/m.  General: Cooperative, alert, well developed, in no acute distress. HEENT: Conjunctivae and lids unremarkable. Cardiovascular: Regular rhythm.  Lungs: Normal work of breathing. Neurologic: No focal deficits.   Lab Results  Component Value Date   CREATININE 1.1 07/31/2021   BUN 9 07/31/2021   NA 137 07/31/2021   K 4.2 07/31/2021   CL 107 07/31/2021   CO2 26 (A) 07/31/2021   Lab Results  Component Value Date   ALT 16 07/31/2021   AST 16 07/31/2021   ALKPHOS 55 07/31/2021   BILITOT 0.5 07/10/2013   Lab Results  Component Value Date   HGBA1C 6.5 12/08/2020   HGBA1C 10.0 (H) 07/09/2013  Lab Results  Component Value Date   INSULIN 17.8 12/14/2020   Lab Results  Component Value Date   TSH 1.970 12/14/2020   Lab Results  Component Value Date   CHOL 199 12/08/2020   HDL 40 12/08/2020   LDLCALC 135 12/08/2020   TRIG 137 12/08/2020   Lab Results  Component Value Date   VD25OH 38.5 07/31/2021   VD25OH 27.5 (L) 12/14/2020   Lab Results  Component Value Date   WBC 6.8 07/31/2021   HGB 14.1 07/31/2021   HCT 42 07/31/2021   MCV 96  12/14/2020   PLT 232 07/31/2021   No results found for: "IRON", "TIBC", "FERRITIN"  Attestation Statements:   Reviewed by clinician on day of visit: allergies, medications, problem list, medical history, surgical history, family history, social history, and previous encounter notes.  I, Davy Pique, RMA, am acting as transcriptionist for Coralie Common, MD.  I have reviewed the above documentation for accuracy and completeness, and I agree with the above. - Coralie Common, MD

## 2022-06-26 ENCOUNTER — Encounter (INDEPENDENT_AMBULATORY_CARE_PROVIDER_SITE_OTHER): Payer: Self-pay | Admitting: Family Medicine

## 2022-06-26 ENCOUNTER — Other Ambulatory Visit (HOSPITAL_COMMUNITY): Payer: Self-pay

## 2022-07-08 ENCOUNTER — Encounter (INDEPENDENT_AMBULATORY_CARE_PROVIDER_SITE_OTHER): Payer: Self-pay | Admitting: Family Medicine

## 2022-07-08 ENCOUNTER — Ambulatory Visit (INDEPENDENT_AMBULATORY_CARE_PROVIDER_SITE_OTHER): Payer: BC Managed Care – PPO | Admitting: Family Medicine

## 2022-07-08 ENCOUNTER — Other Ambulatory Visit (HOSPITAL_COMMUNITY): Payer: Self-pay

## 2022-07-08 VITALS — BP 107/73 | HR 83 | Temp 98.3°F | Ht 64.0 in | Wt 180.0 lb

## 2022-07-08 DIAGNOSIS — Z7985 Long-term (current) use of injectable non-insulin antidiabetic drugs: Secondary | ICD-10-CM

## 2022-07-08 DIAGNOSIS — E669 Obesity, unspecified: Secondary | ICD-10-CM

## 2022-07-08 DIAGNOSIS — E1165 Type 2 diabetes mellitus with hyperglycemia: Secondary | ICD-10-CM

## 2022-07-08 DIAGNOSIS — Z6830 Body mass index (BMI) 30.0-30.9, adult: Secondary | ICD-10-CM | POA: Diagnosis not present

## 2022-07-08 DIAGNOSIS — E559 Vitamin D deficiency, unspecified: Secondary | ICD-10-CM | POA: Diagnosis not present

## 2022-07-08 MED ORDER — VITAMIN D (ERGOCALCIFEROL) 1.25 MG (50000 UNIT) PO CAPS
50000.0000 [IU] | ORAL_CAPSULE | ORAL | 0 refills | Status: DC
  Filled 2022-07-08 – 2022-07-26 (×2): qty 4, 28d supply, fill #0

## 2022-07-08 MED ORDER — OZEMPIC (0.25 OR 0.5 MG/DOSE) 2 MG/3ML ~~LOC~~ SOPN
0.5000 mg | PEN_INJECTOR | SUBCUTANEOUS | 0 refills | Status: DC
  Filled 2022-07-08: qty 9, 84d supply, fill #0
  Filled 2022-07-26: qty 3, 28d supply, fill #0
  Filled 2022-08-25: qty 3, 28d supply, fill #1
  Filled 2022-09-22: qty 3, 28d supply, fill #2

## 2022-07-08 NOTE — Progress Notes (Unsigned)
Chief Complaint:   OBESITY Susan Hale is here to discuss her progress with her obesity treatment plan along with follow-up of her obesity related diagnoses. Susan Hale is on the Category 2 Plan and the Vegetarian Plan and states she is following her eating plan approximately 80% of the time. Susan Hale states she is walking for 15 minutes 5 times per week.  Today's visit was #: 19 Starting weight: 190 LBS Starting date: 12/14/2020 Today's weight: 180 LBS Today's date: 07/08/2022 Total lbs lost to date: 10 LBS Total lbs lost since last in-office visit: 0  Interim History: Patient didn't like the vegetarian type meals and went back to the Cat 2 plan.  Still trying to stay consistent on plan.  She had spring break and stayed at home which was relaxing.  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  No upcoming celebrations or events.   She has been walking more at work for about 15 minutes daily.    Subjective:   1. Vitamin D deficiency Patient is on prescription vitamin D.  Patient denies nausea, vomiting, muscle weakness.  Last labs from May 2023 in epic.  Last labs from [place] [facility].2 from November 2023.  2. Type 2 diabetes mellitus with hyperglycemia, without long-term current use of insulin Patient is on Ozempic with no GI side effects.  Patient last A1c was 5.6 in November 2023.  Assessment/Plan:   1. Vitamin D deficiency Refill - Vitamin D, Ergocalciferol, (DRISDOL) 1.25 MG (50000 UNIT) CAPS capsule; Take 1 capsule (50,000 Units total) by mouth every 7 (seven) days.  Dispense: 4 capsule; Refill: 0  2. Type 2 diabetes mellitus with hyperglycemia, without long-term current use of insulin Refill- Semaglutide,0.25 or 0.5MG /DOS, (OZEMPIC, 0.25 OR 0.5 MG/DOSE,) 2 MG/3ML SOPN; Inject 0.5 mg into the skin once a week.  Dispense: 9 mL; Refill: 0  3. Obesity with current BMI of 31.0 [name] is currently in the action stage of change. As such, her goal is to continue with  weight loss efforts. She has agreed to the Category 2 Plan and the Vegetarian Plan.   Exercise goals: All adults should avoid inactivity. Some physical activity is better than none, and adults who participate in any amount of physical activity gain some health benefits.  Behavioral modification strategies: increasing lean protein intake, meal planning and cooking strategies, keeping healthy foods in the home, and planning for success.  [name] has agreed to follow-up with our clinic in 3-4 weeks. She was informed of the importance of frequent follow-up visits to maximize her success with intensive lifestyle modifications for her multiple health conditions.   Objective:   Blood pressure 107/73, pulse 83, temperature 98.3 F (36.8 C), height 5\' 4"  (1.626 m), weight 180 lb (81.6 kg), SpO2 100 %. Body mass index is 30.9 kg/m.  General: Cooperative, alert, well developed, in no acute distress. HEENT: Conjunctivae and lids unremarkable. Cardiovascular: Regular rhythm.  Lungs: Normal work of breathing. Neurologic: No focal deficits.   Lab Results  Component Value Date   CREATININE 1.1 07/31/2021   BUN 9 07/31/2021   NA 137 07/31/2021   K 4.2 07/31/2021   CL 107 07/31/2021   CO2 26 (A) 07/31/2021   Lab Results  Component Value Date   ALT 16 07/31/2021   AST 16 07/31/2021   ALKPHOS 55 07/31/2021   BILITOT 0.5 07/10/2013   Lab Results  Component Value Date   HGBA1C 6.5 12/08/2020   HGBA1C 10.0 (H) 07/09/2013  Lab Results  Component Value Date   INSULIN 17.8 12/14/2020   Lab Results  Component Value Date   TSH 1.970 12/14/2020   Lab Results  Component Value Date   CHOL 199 12/08/2020   HDL 40 12/08/2020   LDLCALC 135 12/08/2020   TRIG 137 12/08/2020   Lab Results  Component Value Date   VD25OH 38.5 07/31/2021   VD25OH 27.5 (L) 12/14/2020   Lab Results  Component Value Date   WBC 6.8 07/31/2021   HGB 14.1 07/31/2021   HCT 42 07/31/2021   MCV 96 12/14/2020   PLT  232 07/31/2021   No results found for: "IRON", "TIBC", "FERRITIN"  Attestation Statements:   Reviewed by clinician on day of visit: allergies, medications, problem list, medical history, surgical history, family history, social history, and previous encounter notes.  I, Malcolm Metro, RMA, am acting as transcriptionist for Reuben Likes, MD.  I have reviewed the above documentation for accuracy and completeness, and I agree with the above. - Reuben Likes, MD

## 2022-07-25 ENCOUNTER — Other Ambulatory Visit (HOSPITAL_COMMUNITY): Payer: Self-pay

## 2022-07-26 ENCOUNTER — Other Ambulatory Visit: Payer: Self-pay

## 2022-07-26 ENCOUNTER — Other Ambulatory Visit (HOSPITAL_COMMUNITY): Payer: Self-pay

## 2022-08-15 ENCOUNTER — Ambulatory Visit (INDEPENDENT_AMBULATORY_CARE_PROVIDER_SITE_OTHER): Payer: BC Managed Care – PPO | Admitting: Family Medicine

## 2022-08-15 ENCOUNTER — Encounter (INDEPENDENT_AMBULATORY_CARE_PROVIDER_SITE_OTHER): Payer: Self-pay | Admitting: Family Medicine

## 2022-08-15 ENCOUNTER — Other Ambulatory Visit (HOSPITAL_COMMUNITY): Payer: Self-pay

## 2022-08-15 VITALS — BP 102/70 | HR 81 | Temp 98.3°F | Ht 64.0 in | Wt 183.0 lb

## 2022-08-15 DIAGNOSIS — E1169 Type 2 diabetes mellitus with other specified complication: Secondary | ICD-10-CM

## 2022-08-15 DIAGNOSIS — Z7985 Long-term (current) use of injectable non-insulin antidiabetic drugs: Secondary | ICD-10-CM

## 2022-08-15 DIAGNOSIS — E669 Obesity, unspecified: Secondary | ICD-10-CM

## 2022-08-15 DIAGNOSIS — E785 Hyperlipidemia, unspecified: Secondary | ICD-10-CM

## 2022-08-15 DIAGNOSIS — E559 Vitamin D deficiency, unspecified: Secondary | ICD-10-CM

## 2022-08-15 DIAGNOSIS — Z6831 Body mass index (BMI) 31.0-31.9, adult: Secondary | ICD-10-CM

## 2022-08-15 DIAGNOSIS — E1165 Type 2 diabetes mellitus with hyperglycemia: Secondary | ICD-10-CM | POA: Diagnosis not present

## 2022-08-15 MED ORDER — CHOLECALCIFEROL 1.25 MG (50000 UT) PO CAPS
50000.0000 [IU] | ORAL_CAPSULE | ORAL | 0 refills | Status: DC
  Filled 2022-08-15: qty 4, 28d supply, fill #0
  Filled 2022-08-15: qty 4, fill #0

## 2022-08-15 NOTE — Progress Notes (Signed)
Chief Complaint:   OBESITY Susan Hale is here to discuss her progress with her obesity treatment plan along with follow-up of her obesity related diagnoses. Susan Hale is on the Category 2 Plan and the Vegetarian Plan and states she is following her eating plan approximately 100% of the time. Susan Hale states she is walking and doing weight for 25 minutes 5 times per week.  Today's visit was #: 20 Starting weight: 190 LBS Starting date: 12/14/2020 Today's weight: 183 LBS Today's date: 08/15/2022 Total lbs lost to date: 7 LBS Total lbs lost since last in-office visit: +3 LBS  Interim History: Patient has some labs done at PCP.  She has been consistent with her meal plan the last few weeks.  Subjective:   1. Hyperlipidemia associated with type 2 diabetes mellitus (HCC) LDL 142, goal LDL of 70 or below.  Patient is not on any medications.  2. Vitamin D deficiency Patient is on ergocalciferol.  Patient is positive for fatigue.  No real increase in vitamin D even though patient has been taking it.  3. Type 2 diabetes mellitus with hyperglycemia, without long-term current use of insulin (HCC) Patient is on Ozempic 0.5 mg weekly.  Last A1c was 5.6, significant improvement from 6.5.  Assessment/Plan:   1. Hyperlipidemia associated with type 2 diabetes mellitus (HCC) Discussed starting a statin with patient.  She wants to try red yeast rice first  2. Vitamin D deficiency Start/switch- Cholecalciferol 1.25 MG (50000 UT) capsule; Take 1 capsule (50,000 Units total) by mouth once a week.  Dispense: 4 capsule; Refill: 0  3. Type 2 diabetes mellitus with hyperglycemia, without long-term current use of insulin (HCC) Continue Ozempic 0.5 mg subcu weekly.  Check insulin level today.  - Insulin, random  4. Obesity with current BMI of 31.5 Susan Hale is currently in the action stage of change. As such, her goal is to continue with weight loss efforts. She has agreed to the Category 2 Plan.   Exercise  goals:  As is.  Behavioral modification strategies: increasing lean protein intake, meal planning and cooking strategies, keeping healthy foods in the home, and planning for success.  Susan Hale has agreed to follow-up with our clinic in 5-6 weeks. She was informed of the importance of frequent follow-up visits to maximize her success with intensive lifestyle modifications for her multiple health conditions.   Susan Hale was informed we would discuss her lab results at her next visit unless there is a critical issue that needs to be addressed sooner. Susan Hale agreed to keep her next visit at the agreed upon time to discuss these results.  Objective:   Blood pressure 102/70, pulse 81, temperature 98.3 F (36.8 C), height 5\' 4"  (1.626 m), weight 183 lb (83 kg), SpO2 100 %. Body mass index is 31.41 kg/m.  General: Cooperative, alert, well developed, in no acute distress. HEENT: Conjunctivae and lids unremarkable. Cardiovascular: Regular rhythm.  Lungs: Normal work of breathing. Neurologic: No focal deficits.   Lab Results  Component Value Date   CREATININE 1.1 07/31/2021   BUN 9 07/31/2021   NA 137 07/31/2021   K 4.2 07/31/2021   CL 107 07/31/2021   CO2 26 (A) 07/31/2021   Lab Results  Component Value Date   ALT 16 07/31/2021   AST 16 07/31/2021   ALKPHOS 55 07/31/2021   BILITOT 0.5 07/10/2013   Lab Results  Component Value Date   HGBA1C 6.5 12/08/2020   HGBA1C 10.0 (H) 07/09/2013   Lab Results  Component Value  Date   INSULIN 25.2 (H) 08/15/2022   INSULIN 17.8 12/14/2020   Lab Results  Component Value Date   TSH 1.970 12/14/2020   Lab Results  Component Value Date   CHOL 199 12/08/2020   HDL 40 12/08/2020   LDLCALC 135 12/08/2020   TRIG 137 12/08/2020   Lab Results  Component Value Date   VD25OH 38.5 07/31/2021   VD25OH 27.5 (L) 12/14/2020   Lab Results  Component Value Date   WBC 6.8 07/31/2021   HGB 14.1 07/31/2021   HCT 42 07/31/2021   MCV 96 12/14/2020    PLT 232 07/31/2021   No results found for: "IRON", "TIBC", "FERRITIN"  Attestation Statements:   Reviewed by clinician on day of visit: allergies, medications, problem list, medical history, surgical history, family history, social history, and previous encounter notes.  I, Malcolm Metro, RMA, am acting as transcriptionist for Reuben Likes, MD.  I have reviewed the above documentation for accuracy and completeness, and I agree with the above. - Reuben Likes, MD

## 2022-08-16 LAB — INSULIN, RANDOM: INSULIN: 25.2 u[IU]/mL — ABNORMAL HIGH (ref 2.6–24.9)

## 2022-09-19 ENCOUNTER — Ambulatory Visit (INDEPENDENT_AMBULATORY_CARE_PROVIDER_SITE_OTHER): Payer: BC Managed Care – PPO | Admitting: Family Medicine

## 2022-09-22 ENCOUNTER — Other Ambulatory Visit (INDEPENDENT_AMBULATORY_CARE_PROVIDER_SITE_OTHER): Payer: Self-pay | Admitting: Family Medicine

## 2022-09-22 DIAGNOSIS — E559 Vitamin D deficiency, unspecified: Secondary | ICD-10-CM

## 2022-09-23 ENCOUNTER — Other Ambulatory Visit (HOSPITAL_COMMUNITY): Payer: Self-pay

## 2022-09-23 ENCOUNTER — Other Ambulatory Visit: Payer: Self-pay

## 2022-09-23 ENCOUNTER — Ambulatory Visit (INDEPENDENT_AMBULATORY_CARE_PROVIDER_SITE_OTHER): Payer: BC Managed Care – PPO | Admitting: Family Medicine

## 2022-09-23 ENCOUNTER — Encounter (INDEPENDENT_AMBULATORY_CARE_PROVIDER_SITE_OTHER): Payer: Self-pay | Admitting: Family Medicine

## 2022-09-23 VITALS — BP 104/70 | HR 76 | Temp 97.6°F | Ht 64.0 in | Wt 186.0 lb

## 2022-09-23 DIAGNOSIS — Z6831 Body mass index (BMI) 31.0-31.9, adult: Secondary | ICD-10-CM

## 2022-09-23 DIAGNOSIS — E559 Vitamin D deficiency, unspecified: Secondary | ICD-10-CM

## 2022-09-23 DIAGNOSIS — E1165 Type 2 diabetes mellitus with hyperglycemia: Secondary | ICD-10-CM

## 2022-09-23 DIAGNOSIS — Z6832 Body mass index (BMI) 32.0-32.9, adult: Secondary | ICD-10-CM

## 2022-09-23 DIAGNOSIS — E669 Obesity, unspecified: Secondary | ICD-10-CM

## 2022-09-23 DIAGNOSIS — Z7985 Long-term (current) use of injectable non-insulin antidiabetic drugs: Secondary | ICD-10-CM

## 2022-09-23 MED ORDER — OZEMPIC (0.25 OR 0.5 MG/DOSE) 2 MG/3ML ~~LOC~~ SOPN
0.5000 mg | PEN_INJECTOR | SUBCUTANEOUS | 0 refills | Status: DC
  Filled 2022-09-23: qty 3, 28d supply, fill #0
  Filled 2022-10-22 – 2022-11-08 (×2): qty 3, 28d supply, fill #1

## 2022-09-23 MED ORDER — CHOLECALCIFEROL 1.25 MG (50000 UT) PO CAPS
50000.0000 [IU] | ORAL_CAPSULE | ORAL | 0 refills | Status: DC
  Filled 2022-09-23: qty 4, 28d supply, fill #0

## 2022-09-23 NOTE — Progress Notes (Unsigned)
Chief Complaint:   OBESITY Susan Hale is here to discuss her progress with her obesity treatment plan along with follow-up of her obesity related diagnoses. Susan Hale is on the Category 2 Plan and states she is following her eating plan approximately 80% of the time. Susan Hale states she is walking for 10-15 minutes 5 times per week.  Today's visit was #: 21 Starting weight: 190 lbs Starting date: 12/14/2020 Today's weight: 186 lbs Today's date: 09/23/2022 Total lbs lost to date: 4 Total lbs lost since last in-office visit: 0  Interim History: Patient has been eating more off plan because of the heat- she tends to gravitate toward drinking water more than eating.  Has been eating more sausage with [place] links or patties and then ate grapes for lunch.  She has been sitting more and snacking more.  Going to [employer] for July 4th. Wondering about incorporating [employer].    Subjective:   1. Vitamin D deficiency Patient's last vitamin D level was of 34.7 in early May.  She denies nausea, vomiting, or muscle weakness but notes fatigue.  2. Type 2 diabetes mellitus with hyperglycemia, without long-term current use of insulin (HCC) Patient is on Ozempic weekly with no GI side effects noted.  She notes minimal appetite on 0.5 mg.  Assessment/Plan:   1. Vitamin D deficiency We will refill prescription vitamin D once weekly for 1 month.  - Cholecalciferol 1.25 MG (50000 UT) capsule; Take 1 capsule (50,000 Units total) by mouth once a week.  Dispense: 4 capsule; Refill: 0  2. Type 2 diabetes mellitus with hyperglycemia, without long-term current use of insulin (HCC) Patient will continue her medications at same dose with no change in treatment.  We will refill Ozempic 0.5 mg for 90 days.  - Semaglutide,0.25 or 0.5MG /DOS, (OZEMPIC, 0.25 OR 0.5 MG/DOSE,) 2 MG/3ML SOPN; Inject 0.5 mg into the skin once a week.  Dispense: 9 mL; Refill: 0  3. BMI 32.0-32.9,adult  4. Obesity with starting BMI of  32.6 Susan Hale is currently in the action stage of change. As such, her goal is to continue with weight loss efforts. She has agreed to keeping a food journal and adhering to recommended goals of 1150-1200 calories and 80+ grams of protein daily.   Exercise goals: All adults should avoid inactivity. Some physical activity is better than none, and adults who participate in any amount of physical activity gain some health benefits.  Behavioral modification strategies: increasing lean protein intake, meal planning and cooking strategies, keeping healthy foods in the home, and planning for success.  Susan Hale has agreed to follow-up with our clinic in 4 to 5 weeks. She was informed of the importance of frequent follow-up visits to maximize her success with intensive lifestyle modifications for her multiple health conditions.   Objective:   Blood pressure 104/70, pulse 76, temperature 97.6 F (36.4 C), height 5\' 4"  (1.626 m), weight 186 lb (84.4 kg), SpO2 100 %. Body mass index is 31.93 kg/m.  General: Cooperative, alert, well developed, in no acute distress. HEENT: Conjunctivae and lids unremarkable. Cardiovascular: Regular rhythm.  Lungs: Normal work of breathing. Neurologic: No focal deficits.   Lab Results  Component Value Date   CREATININE 1.1 07/31/2021   BUN 9 07/31/2021   NA 137 07/31/2021   K 4.2 07/31/2021   CL 107 07/31/2021   CO2 26 (A) 07/31/2021   Lab Results  Component Value Date   ALT 16 07/31/2021   AST 16 07/31/2021   ALKPHOS 55  07/31/2021   BILITOT 0.5 07/10/2013   Lab Results  Component Value Date   HGBA1C 6.5 12/08/2020   HGBA1C 10.0 (H) 07/09/2013   Lab Results  Component Value Date   INSULIN 25.2 (H) 08/15/2022   INSULIN 17.8 12/14/2020   Lab Results  Component Value Date   TSH 1.970 12/14/2020   Lab Results  Component Value Date   CHOL 199 12/08/2020   HDL 40 12/08/2020   LDLCALC 135 12/08/2020   TRIG 137 12/08/2020   Lab Results  Component  Value Date   VD25OH 38.5 07/31/2021   VD25OH 27.5 (L) 12/14/2020   Lab Results  Component Value Date   WBC 6.8 07/31/2021   HGB 14.1 07/31/2021   HCT 42 07/31/2021   MCV 96 12/14/2020   PLT 232 07/31/2021   No results found for: "IRON", "TIBC", "FERRITIN"  Attestation Statements:   Reviewed by clinician on day of visit: allergies, medications, problem list, medical history, surgical history, family history, social history, and previous encounter notes.   I, Burt Knack, am acting as transcriptionist for Reuben Likes, MD.  I have reviewed the above documentation for accuracy and completeness, and I agree with the above. - Reuben Likes, MD

## 2022-10-22 ENCOUNTER — Ambulatory Visit (INDEPENDENT_AMBULATORY_CARE_PROVIDER_SITE_OTHER): Payer: BC Managed Care – PPO | Admitting: Family Medicine

## 2022-10-22 ENCOUNTER — Other Ambulatory Visit (HOSPITAL_COMMUNITY): Payer: Self-pay

## 2022-10-22 ENCOUNTER — Other Ambulatory Visit (INDEPENDENT_AMBULATORY_CARE_PROVIDER_SITE_OTHER): Payer: Self-pay | Admitting: Family Medicine

## 2022-10-22 ENCOUNTER — Other Ambulatory Visit: Payer: Self-pay

## 2022-10-22 DIAGNOSIS — E559 Vitamin D deficiency, unspecified: Secondary | ICD-10-CM

## 2022-11-04 ENCOUNTER — Other Ambulatory Visit (HOSPITAL_COMMUNITY): Payer: Self-pay

## 2022-11-08 ENCOUNTER — Other Ambulatory Visit (HOSPITAL_COMMUNITY): Payer: Self-pay

## 2022-11-14 ENCOUNTER — Ambulatory Visit (INDEPENDENT_AMBULATORY_CARE_PROVIDER_SITE_OTHER): Payer: BC Managed Care – PPO | Admitting: Family Medicine

## 2022-11-14 ENCOUNTER — Other Ambulatory Visit (HOSPITAL_COMMUNITY): Payer: Self-pay

## 2022-11-14 VITALS — BP 134/81 | HR 78 | Temp 98.0°F | Ht 64.0 in | Wt 190.0 lb

## 2022-11-14 DIAGNOSIS — K5909 Other constipation: Secondary | ICD-10-CM

## 2022-11-14 DIAGNOSIS — Z6832 Body mass index (BMI) 32.0-32.9, adult: Secondary | ICD-10-CM

## 2022-11-14 DIAGNOSIS — E559 Vitamin D deficiency, unspecified: Secondary | ICD-10-CM

## 2022-11-14 DIAGNOSIS — E1165 Type 2 diabetes mellitus with hyperglycemia: Secondary | ICD-10-CM

## 2022-11-14 DIAGNOSIS — E669 Obesity, unspecified: Secondary | ICD-10-CM

## 2022-11-14 DIAGNOSIS — Z7984 Long term (current) use of oral hypoglycemic drugs: Secondary | ICD-10-CM

## 2022-11-14 MED ORDER — OZEMPIC (0.25 OR 0.5 MG/DOSE) 2 MG/3ML ~~LOC~~ SOPN
0.5000 mg | PEN_INJECTOR | SUBCUTANEOUS | 0 refills | Status: AC
Start: 2022-11-14 — End: ?
  Filled 2022-11-14 (×2): qty 6, 56d supply, fill #0

## 2022-11-14 MED ORDER — CHOLECALCIFEROL 1.25 MG (50000 UT) PO CAPS
50000.0000 [IU] | ORAL_CAPSULE | ORAL | 0 refills | Status: AC
Start: 2022-11-14 — End: ?
  Filled 2022-11-14: qty 12, 84d supply, fill #0

## 2022-11-14 NOTE — Progress Notes (Signed)
Chief Complaint:   OBESITY Susan Hale is here to discuss her progress with her obesity treatment plan along with follow-up of her obesity related diagnoses. Susan Hale is on keeping a food journal and adhering to recommended goals of 1150-1200 calories and 80+ grams of protein and states she is following her eating plan approximately 80% of the time. Susan Hale states she is walking and using dumb bell weights for 45 minutes 3 times per week.  Today's visit was #: 22 Starting weight: 190 lbs Starting date: 12/14/2020 Today's weight: 190 lbs Today's date: 11/14/2022 Total lbs lost to date: 0 Total lbs lost since last in-office visit: 0  Interim History: Patient has been struggling to make it to appointments as she is having to leave work early to get to the appointments.  She is eating some protein but taking in some protein shake to get in doable protein.  Total calorie intake is less than 1200 calories and she is trying to stay mindful of making sure most of what she gets in has protein.  She does voice she is experiencing some constipation.   Subjective:   1. Type 2 diabetes mellitus with hyperglycemia, without long-term current use of insulin (HCC) Patient is on Ozempic weekly, and she notes significant satiety and decrease in appetite on Ozempic.  2. Vitamin D deficiency Patient is on prescription vitamin D, and she notes fatigue.  3. Other constipation Patient is not taking MiraLAX daily.  She is not going daily (in terms of BM), and when she does she is experiencing spasms.  Assessment/Plan:   1. Type 2 diabetes mellitus with hyperglycemia, without long-term current use of insulin (HCC) Patient will continue Ozempic 0.5 mg subcu weekly, and we will refill for 90 days.  - Semaglutide,0.25 or 0.5MG /DOS, (OZEMPIC, 0.25 OR 0.5 MG/DOSE,) 2 MG/3ML SOPN; Inject 0.5 mg into the skin once a week.  Dispense: 9 mL; Refill: 0  2. Vitamin D deficiency Patient will continue prescription vitamin D  once weekly, and we will refill for 90 days.  - Cholecalciferol 1.25 MG (50000 UT) capsule; Take 1 capsule (50,000 Units total) by mouth once a week.  Dispense: 12 capsule; Refill: 0  3. Other constipation Patient was encouraged to take MiraLAX daily for 1 month then space it out to every other day.  4. BMI 32.0-32.9,adult -current BMI 32.6  5. Obesity with starting BMI of 32.6 Susan Hale is currently in the action stage of change. As such, her goal is to continue with weight loss efforts. She has agreed to keeping a food journal and adhering to recommended goals of 1150-1200 calories and 80+ grams of protein daily.   Exercise goals: All adults should avoid inactivity. Some physical activity is better than none, and adults who participate in any amount of physical activity gain some health benefits.  Behavioral modification strategies: increasing lean protein intake, meal planning and cooking strategies, keeping healthy foods in the home, and planning for success.  Susan Hale has agreed to follow-up with our clinic in 6 weeks. She was informed of the importance of frequent follow-up visits to maximize her success with intensive lifestyle modifications for her multiple health conditions.   Objective:   Blood pressure 134/81, pulse 78, temperature 98 F (36.7 C), height 5\' 4"  (1.626 m), weight 190 lb (86.2 kg), SpO2 100%. Body mass index is 32.61 kg/m.  General: Cooperative, alert, well developed, in no acute distress. HEENT: Conjunctivae and lids unremarkable. Cardiovascular: Regular rhythm.  Lungs: Normal work of breathing. Neurologic:  No focal deficits.   Lab Results  Component Value Date   CREATININE 1.1 07/31/2021   BUN 9 07/31/2021   NA 137 07/31/2021   K 4.2 07/31/2021   CL 107 07/31/2021   CO2 26 (A) 07/31/2021   Lab Results  Component Value Date   ALT 16 07/31/2021   AST 16 07/31/2021   ALKPHOS 55 07/31/2021   BILITOT 0.5 07/10/2013   Lab Results  Component Value Date    HGBA1C 6.5 12/08/2020   HGBA1C 10.0 (H) 07/09/2013   Lab Results  Component Value Date   INSULIN 25.2 (H) 08/15/2022   INSULIN 17.8 12/14/2020   Lab Results  Component Value Date   TSH 1.970 12/14/2020   Lab Results  Component Value Date   CHOL 199 12/08/2020   HDL 40 12/08/2020   LDLCALC 135 12/08/2020   TRIG 137 12/08/2020   Lab Results  Component Value Date   VD25OH 38.5 07/31/2021   VD25OH 27.5 (L) 12/14/2020   Lab Results  Component Value Date   WBC 6.8 07/31/2021   HGB 14.1 07/31/2021   HCT 42 07/31/2021   MCV 96 12/14/2020   PLT 232 07/31/2021   No results found for: "IRON", "TIBC", "FERRITIN"  Attestation Statements:   Reviewed by clinician on day of visit: allergies, medications, problem list, medical history, surgical history, family history, social history, and previous encounter notes.   I, Burt Knack, am acting as transcriptionist for Reuben Likes, MD.  I have reviewed the above documentation for accuracy and completeness, and I agree with the above. - Reuben Likes, MD

## 2022-11-15 ENCOUNTER — Other Ambulatory Visit (HOSPITAL_COMMUNITY): Payer: Self-pay

## 2022-11-27 ENCOUNTER — Other Ambulatory Visit (HOSPITAL_COMMUNITY): Payer: Self-pay

## 2022-12-30 ENCOUNTER — Encounter (INDEPENDENT_AMBULATORY_CARE_PROVIDER_SITE_OTHER): Payer: Self-pay | Admitting: Family Medicine

## 2022-12-30 ENCOUNTER — Other Ambulatory Visit: Payer: Self-pay

## 2022-12-30 ENCOUNTER — Other Ambulatory Visit (HOSPITAL_COMMUNITY): Payer: Self-pay

## 2022-12-30 ENCOUNTER — Telehealth (INDEPENDENT_AMBULATORY_CARE_PROVIDER_SITE_OTHER): Payer: BC Managed Care – PPO | Admitting: Family Medicine

## 2022-12-30 DIAGNOSIS — E669 Obesity, unspecified: Secondary | ICD-10-CM | POA: Diagnosis not present

## 2022-12-30 DIAGNOSIS — Z6832 Body mass index (BMI) 32.0-32.9, adult: Secondary | ICD-10-CM | POA: Diagnosis not present

## 2022-12-30 DIAGNOSIS — E1165 Type 2 diabetes mellitus with hyperglycemia: Secondary | ICD-10-CM | POA: Diagnosis not present

## 2022-12-30 DIAGNOSIS — Z7985 Long-term (current) use of injectable non-insulin antidiabetic drugs: Secondary | ICD-10-CM

## 2022-12-30 DIAGNOSIS — E66811 Obesity, class 1: Secondary | ICD-10-CM

## 2022-12-30 DIAGNOSIS — E559 Vitamin D deficiency, unspecified: Secondary | ICD-10-CM

## 2022-12-30 MED ORDER — OZEMPIC (0.25 OR 0.5 MG/DOSE) 2 MG/3ML ~~LOC~~ SOPN
0.5000 mg | PEN_INJECTOR | SUBCUTANEOUS | 0 refills | Status: AC
Start: 2022-12-30 — End: ?
  Filled 2022-12-30: qty 3, 28d supply, fill #0
  Filled 2023-04-07: qty 3, 28d supply, fill #1
  Filled 2023-05-01: qty 3, 28d supply, fill #2

## 2022-12-30 MED ORDER — CHOLECALCIFEROL 1.25 MG (50000 UT) PO CAPS
50000.0000 [IU] | ORAL_CAPSULE | ORAL | 0 refills | Status: AC
Start: 2022-12-30 — End: ?
  Filled 2022-12-30: qty 12, 84d supply, fill #0

## 2022-12-30 NOTE — Progress Notes (Signed)
TeleHealth Visit:  Due to the COVID-19 pandemic, this visit was completed with telemedicine (audio/video) technology to reduce patient and provider exposure as well as to preserve personal protective equipment.   [name] has verbally consented to this TeleHealth visit. The patient is located at home, the provider is located at the [facility] office. The participants in this visit include the listed provider and patient. The visit was conducted today via MyChart video.   Chief Complaint: OBESITY Susan Hale is here to discuss her progress with her obesity treatment plan along with follow-up of her obesity related diagnoses. [name] is on keeping a food journal and adhering to recommended goals of 1150-1200 calories and 80+ grams of protein and states she is following her eating plan approximately 80% of the time. [name] states she is walking and lifting weights for 25 minutes 5 times per week.  Today's visit was #: 23 Starting weight: 190 lbs Starting date: 12/14/2020  Interim History: Patient has been doing well with logging consistently since her last appointment.  She is getting between 1100-1200 calories and trying to get more consistent protein in.  She may be getting and 50 grams of protein total.  Over the next few weeks she has no plans except for work.  She is planning to bring her mother to a concert at the end of October.  Subjective:   1. Vitamin D deficiency Patient's last vitamin D level in May 2024 was 34.7.  She notes fatigue.  2. Type 2 diabetes mellitus with hyperglycemia, without long-term current use of insulin (HCC) Patient is on Ozempic with significant control.  Her last A1c was 5.6 in May 2024.  Assessment/Plan:   1. Vitamin D deficiency We will refill prescription vitamin D 50,000 IU once weekly for 90 days.  - Cholecalciferol 1.25 MG (50000 UT) capsule; Take 1 capsule (50,000 Units total) by mouth once a week.  Dispense: 12 capsule; Refill: 0  2.  Type 2 diabetes mellitus with hyperglycemia, without long-term current use of insulin (HCC) We will refill Ozempic 0.5 mg subcu weekly for 90 days.  - Semaglutide,0.25 or 0.5MG /DOS, (OZEMPIC, 0.25 OR 0.5 MG/DOSE,) 2 MG/3ML SOPN; Inject 0.5 mg into the skin once a week.  Dispense: 9 mL; Refill: 0  3. BMI 32.0-32.9,adult  4. Obesity with starting BMI of 32.6 [name] is currently in the action stage of change. As such, her goal is to continue with weight loss efforts. She has agreed to keeping a food journal and adhering to recommended goals of 1100-1200 calories and 80+ grams of protein daily.   Exercise goals: All adults should avoid inactivity. Some physical activity is better than none, and adults who participate in any amount of physical activity gain some health benefits.  Behavioral modification strategies: increasing lean protein intake, meal planning and cooking strategies, keeping healthy foods in the home, and planning for success.  Astha has agreed to follow-up with our clinic in 4 weeks. She was informed of the importance of frequent follow-up visits to maximize her success with intensive lifestyle modifications for her multiple health conditions.  Objective:   VITALS: Per patient if applicable, see vitals. GENERAL: Alert and in no acute distress. CARDIOPULMONARY: No increased WOB. Speaking in clear sentences.  PSYCH: Pleasant and cooperative. Speech normal rate and rhythm. Affect is appropriate. Insight and judgement are appropriate. Attention is focused, linear, and appropriate.  NEURO: Oriented as arrived to appointment on time with no prompting.   Lab Results  Component Value Date  CREATININE 1.1 07/31/2021   BUN 9 07/31/2021   NA 137 07/31/2021   K 4.2 07/31/2021   CL 107 07/31/2021   CO2 26 (A) 07/31/2021   Lab Results  Component Value Date   ALT 16 07/31/2021   AST 16 07/31/2021   ALKPHOS 55 07/31/2021   BILITOT 0.5 07/10/2013   Lab Results  Component Value  Date   HGBA1C 6.5 12/08/2020   HGBA1C 10.0 (H) 07/09/2013   Lab Results  Component Value Date   INSULIN 25.2 (H) 08/15/2022   INSULIN 17.8 12/14/2020   Lab Results  Component Value Date   TSH 1.970 12/14/2020   Lab Results  Component Value Date   CHOL 199 12/08/2020   HDL 40 12/08/2020   LDLCALC 135 12/08/2020   TRIG 137 12/08/2020   Lab Results  Component Value Date   VD25OH 38.5 07/31/2021   VD25OH 27.5 (L) 12/14/2020   Lab Results  Component Value Date   WBC 6.8 07/31/2021   HGB 14.1 07/31/2021   HCT 42 07/31/2021   MCV 96 12/14/2020   PLT 232 07/31/2021   No results found for: "IRON", "TIBC", "FERRITIN"  Attestation Statements:   Reviewed by clinician on day of visit: allergies, medications, problem list, medical history, surgical history, family history, social history, and previous encounter notes.   I, Burt Knack, am acting as transcriptionist for Reuben Likes, MD.  I have reviewed the above documentation for accuracy and completeness, and I agree with the above. - Reuben Likes, MD

## 2022-12-31 ENCOUNTER — Encounter (HOSPITAL_BASED_OUTPATIENT_CLINIC_OR_DEPARTMENT_OTHER): Payer: Self-pay | Admitting: Emergency Medicine

## 2022-12-31 ENCOUNTER — Emergency Department (HOSPITAL_BASED_OUTPATIENT_CLINIC_OR_DEPARTMENT_OTHER)
Admission: EM | Admit: 2022-12-31 | Discharge: 2022-12-31 | Disposition: A | Discharge status: Home / Self Care | Payer: BC Managed Care – PPO | Attending: Emergency Medicine | Admitting: Emergency Medicine

## 2022-12-31 ENCOUNTER — Emergency Department (HOSPITAL_BASED_OUTPATIENT_CLINIC_OR_DEPARTMENT_OTHER): Payer: BC Managed Care – PPO

## 2022-12-31 DIAGNOSIS — M546 Pain in thoracic spine: Secondary | ICD-10-CM | POA: Diagnosis not present

## 2022-12-31 DIAGNOSIS — M545 Low back pain, unspecified: Secondary | ICD-10-CM | POA: Insufficient documentation

## 2022-12-31 DIAGNOSIS — M549 Dorsalgia, unspecified: Secondary | ICD-10-CM

## 2022-12-31 DIAGNOSIS — M542 Cervicalgia: Secondary | ICD-10-CM | POA: Diagnosis present

## 2022-12-31 DIAGNOSIS — Y92410 Unspecified street and highway as the place of occurrence of the external cause: Secondary | ICD-10-CM | POA: Insufficient documentation

## 2022-12-31 LAB — PREGNANCY, URINE: Preg Test, Ur: NEGATIVE

## 2022-12-31 MED ORDER — CYCLOBENZAPRINE HCL 5 MG PO TABS
5.0000 mg | ORAL_TABLET | Freq: Three times a day (TID) | ORAL | 0 refills | Status: AC | PRN
  Filled 2022-12-31: qty 30, 10d supply, fill #0

## 2022-12-31 MED ORDER — IBUPROFEN 800 MG PO TABS
800.0000 mg | ORAL_TABLET | Freq: Once | ORAL | Status: AC
  Administered 2022-12-31: 800 mg via ORAL
  Filled 2022-12-31: qty 1

## 2022-12-31 MED ORDER — CYCLOBENZAPRINE HCL 5 MG PO TABS
5.0000 mg | ORAL_TABLET | Freq: Once | ORAL | Status: AC
  Administered 2022-12-31: 5 mg via ORAL
  Filled 2022-12-31: qty 1
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
## 2023-01-01 ENCOUNTER — Other Ambulatory Visit (HOSPITAL_COMMUNITY): Payer: Self-pay

## 2023-01-03 ENCOUNTER — Other Ambulatory Visit (HOSPITAL_COMMUNITY): Payer: Self-pay

## 2023-05-02 ENCOUNTER — Other Ambulatory Visit (HOSPITAL_COMMUNITY): Payer: Self-pay

## 2023-08-15 ENCOUNTER — Other Ambulatory Visit: Payer: Self-pay

## 2023-08-15 MED ORDER — AMOXICILLIN 500 MG PO CAPS
500.0000 mg | ORAL_CAPSULE | Freq: Two times a day (BID) | ORAL | 0 refills | Status: AC
  Filled 2023-08-15: qty 14, 7d supply, fill #0

## 2023-08-15 MED ORDER — CHOLECALCIFEROL 1.25 MG (50000 UT) PO CAPS
50000.0000 [IU] | ORAL_CAPSULE | ORAL | 4 refills | Status: AC
  Filled 2023-08-15: qty 12, 84d supply, fill #0
  Filled 2023-11-10 – 2023-12-07 (×2): qty 12, 84d supply, fill #1
  Filled 2024-03-04 – 2024-03-17 (×2): qty 12, 84d supply, fill #2

## 2023-08-15 MED ORDER — OZEMPIC (0.25 OR 0.5 MG/DOSE) 2 MG/3ML ~~LOC~~ SOPN
0.5000 mg | PEN_INJECTOR | SUBCUTANEOUS | 3 refills | Status: AC
  Filled 2023-08-15: qty 3, 28d supply, fill #0
  Filled 2023-09-11: qty 3, 28d supply, fill #1
  Filled 2023-11-10: qty 3, 28d supply, fill #2
  Filled 2023-12-07: qty 3, 28d supply, fill #3
  Filled 2023-12-30: qty 3, 28d supply, fill #4
  Filled 2024-01-01: qty 3, 28d supply, fill #0
  Filled 2024-03-04 – 2024-03-17 (×2): qty 3, 28d supply, fill #1
  Filled 2024-03-30: qty 3, 28d supply, fill #2

## 2023-08-19 ENCOUNTER — Other Ambulatory Visit: Payer: Self-pay

## 2023-08-19 ENCOUNTER — Other Ambulatory Visit (HOSPITAL_COMMUNITY): Payer: Self-pay

## 2023-11-11 ENCOUNTER — Other Ambulatory Visit: Payer: Self-pay

## 2023-11-11 ENCOUNTER — Other Ambulatory Visit (HOSPITAL_COMMUNITY): Payer: Self-pay

## 2023-11-20 ENCOUNTER — Other Ambulatory Visit (HOSPITAL_COMMUNITY): Payer: Self-pay

## 2023-11-25 ENCOUNTER — Other Ambulatory Visit: Payer: Self-pay

## 2023-11-25 MED ORDER — TRETINOIN 0.05 % EX CREA
1.0000 "application " | TOPICAL_CREAM | Freq: Every evening | CUTANEOUS | 3 refills | Status: AC
  Filled 2023-11-25: qty 45, 30d supply, fill #0
  Filled 2023-12-30: qty 45, 30d supply, fill #1
  Filled 2024-01-01: qty 45, 30d supply, fill #0
  Filled 2024-03-04: qty 45, 30d supply, fill #1
  Filled 2024-03-17: qty 45, 60d supply, fill #1
  Filled 2024-03-30: qty 45, 60d supply, fill #2

## 2023-11-26 ENCOUNTER — Other Ambulatory Visit: Payer: Self-pay

## 2023-12-02 ENCOUNTER — Other Ambulatory Visit: Payer: Self-pay

## 2023-12-07 ENCOUNTER — Other Ambulatory Visit (HOSPITAL_COMMUNITY): Payer: Self-pay

## 2024-01-01 ENCOUNTER — Other Ambulatory Visit (HOSPITAL_COMMUNITY): Payer: Self-pay

## 2024-01-01 ENCOUNTER — Other Ambulatory Visit: Payer: Self-pay

## 2024-03-04 ENCOUNTER — Other Ambulatory Visit (HOSPITAL_COMMUNITY): Payer: Self-pay

## 2024-03-16 ENCOUNTER — Other Ambulatory Visit (HOSPITAL_COMMUNITY): Payer: Self-pay

## 2024-03-17 ENCOUNTER — Other Ambulatory Visit (HOSPITAL_COMMUNITY): Payer: Self-pay

## 2024-03-30 ENCOUNTER — Other Ambulatory Visit (HOSPITAL_COMMUNITY): Payer: Self-pay
